# Patient Record
Sex: Female | Born: 1995 | Race: Black or African American | Hispanic: No | Smoking: Never smoker
Health system: Southern US, Community
[De-identification: ages and names within clinical notes are randomized; demographics above are authoritative.]

## PROBLEM LIST (undated history)

## (undated) DIAGNOSIS — F909 Attention-deficit hyperactivity disorder, unspecified type: Secondary | ICD-10-CM

## (undated) DIAGNOSIS — F32A Depression, unspecified: Secondary | ICD-10-CM

## (undated) DIAGNOSIS — Z789 Other specified health status: Secondary | ICD-10-CM

## (undated) DIAGNOSIS — O009 Unspecified ectopic pregnancy without intrauterine pregnancy: Secondary | ICD-10-CM

## (undated) DIAGNOSIS — K219 Gastro-esophageal reflux disease without esophagitis: Secondary | ICD-10-CM

## (undated) DIAGNOSIS — F419 Anxiety disorder, unspecified: Secondary | ICD-10-CM

## (undated) HISTORY — DX: Attention-deficit hyperactivity disorder, unspecified type: F90.9

## (undated) HISTORY — DX: Anxiety disorder, unspecified: F41.9

## (undated) HISTORY — DX: Depression, unspecified: F32.A

## (undated) HISTORY — DX: Gastro-esophageal reflux disease without esophagitis: K21.9

## (undated) HISTORY — PX: FOOT SURGERY: SHX648

---

## 2022-04-22 ENCOUNTER — Ambulatory Visit (INDEPENDENT_AMBULATORY_CARE_PROVIDER_SITE_OTHER): Payer: Managed Care, Other (non HMO) | Admitting: Obstetrics and Gynecology

## 2022-04-22 DIAGNOSIS — Z3200 Encounter for pregnancy test, result unknown: Secondary | ICD-10-CM

## 2022-04-22 LAB — POCT URINE PREGNANCY: Preg Test, Ur: POSITIVE — AB

## 2022-04-22 NOTE — Progress Notes (Unsigned)
Patient presents for pregnancy urine confirmation has the next appointments set up with [facility].CMA for new ob intake.LMP of 03/20/2022 No issues or concerns.

## 2022-04-24 ENCOUNTER — Emergency Department
Admission: EM | Admit: 2022-04-24 | Discharge: 2022-04-25 | Disposition: A | Discharge status: Home / Self Care | Payer: Managed Care, Other (non HMO) | Attending: Emergency Medicine | Admitting: Emergency Medicine

## 2022-04-24 ENCOUNTER — Emergency Department: Payer: Managed Care, Other (non HMO)

## 2022-04-24 ENCOUNTER — Other Ambulatory Visit: Payer: Self-pay

## 2022-04-24 ENCOUNTER — Encounter: Payer: Self-pay | Admitting: Obstetrics and Gynecology

## 2022-04-24 ENCOUNTER — Encounter: Payer: Self-pay | Admitting: Emergency Medicine

## 2022-04-24 DIAGNOSIS — Z3A01 Less than 8 weeks gestation of pregnancy: Secondary | ICD-10-CM | POA: Diagnosis not present

## 2022-04-24 DIAGNOSIS — O23591 Infection of other part of genital tract in pregnancy, first trimester: Secondary | ICD-10-CM | POA: Insufficient documentation

## 2022-04-24 DIAGNOSIS — O00201 Right ovarian pregnancy without intrauterine pregnancy: Secondary | ICD-10-CM | POA: Insufficient documentation

## 2022-04-24 DIAGNOSIS — Z6710 Type A blood, Rh positive: Secondary | ICD-10-CM | POA: Diagnosis not present

## 2022-04-24 DIAGNOSIS — O209 Hemorrhage in early pregnancy, unspecified: Secondary | ICD-10-CM | POA: Diagnosis present

## 2022-04-24 DIAGNOSIS — B9689 Other specified bacterial agents as the cause of diseases classified elsewhere: Secondary | ICD-10-CM | POA: Diagnosis not present

## 2022-04-24 DIAGNOSIS — O4690 Antepartum hemorrhage, unspecified, unspecified trimester: Secondary | ICD-10-CM

## 2022-04-24 LAB — CHLAMYDIA/NGC RT PCR (ARMC ONLY)
Chlamydia Tr: NOT DETECTED
N gonorrhoeae: NOT DETECTED

## 2022-04-24 LAB — CBC WITH DIFFERENTIAL/PLATELET
Abs Immature Granulocytes: 0.01 10*3/uL (ref 0.00–0.07)
Basophils Absolute: 0 10*3/uL (ref 0.0–0.1)
Basophils Relative: 1 %
Eosinophils Absolute: 0.1 10*3/uL (ref 0.0–0.5)
Eosinophils Relative: 1 %
HCT: 37.5 % (ref 36.0–46.0)
Hemoglobin: 11.4 g/dL — ABNORMAL LOW (ref 12.0–15.0)
Immature Granulocytes: 0 %
Lymphocytes Relative: 45 %
Lymphs Abs: 2.3 10*3/uL (ref 0.7–4.0)
MCH: 23.1 pg — ABNORMAL LOW (ref 26.0–34.0)
MCHC: 30.4 g/dL (ref 30.0–36.0)
MCV: 75.9 fL — ABNORMAL LOW (ref 80.0–100.0)
Monocytes Absolute: 0.3 10*3/uL (ref 0.1–1.0)
Monocytes Relative: 6 %
Neutro Abs: 2.5 10*3/uL (ref 1.7–7.7)
Neutrophils Relative %: 47 %
Platelets: 288 10*3/uL (ref 150–400)
RBC: 4.94 MIL/uL (ref 3.87–5.11)
RDW: 15.9 % — ABNORMAL HIGH (ref 11.5–15.5)
WBC: 5.2 10*3/uL (ref 4.0–10.5)
nRBC: 0 % (ref 0.0–0.2)

## 2022-04-24 LAB — URINALYSIS, ROUTINE W REFLEX MICROSCOPIC
Bilirubin Urine: NEGATIVE
Glucose, UA: NEGATIVE mg/dL
Hgb urine dipstick: NEGATIVE
Ketones, ur: NEGATIVE mg/dL
Leukocytes,Ua: NEGATIVE
Nitrite: NEGATIVE
Protein, ur: NEGATIVE mg/dL
Specific Gravity, Urine: 1.026 (ref 1.005–1.030)
pH: 7 (ref 5.0–8.0)

## 2022-04-24 LAB — COMPREHENSIVE METABOLIC PANEL
ALT: 17 U/L (ref 0–44)
AST: 19 U/L (ref 15–41)
Albumin: 3.7 g/dL (ref 3.5–5.0)
Alkaline Phosphatase: 58 U/L (ref 38–126)
Anion gap: 6 (ref 5–15)
BUN: 11 mg/dL (ref 6–20)
CO2: 25 mmol/L (ref 22–32)
Calcium: 9.4 mg/dL (ref 8.9–10.3)
Chloride: 105 mmol/L (ref 98–111)
Creatinine, Ser: 0.88 mg/dL (ref 0.44–1.00)
GFR, Estimated: >60 mL/min (ref >60)
Glucose, Bld: 114 mg/dL — ABNORMAL HIGH (ref 70–99)
Potassium: 3.6 mmol/L (ref 3.5–5.1)
Sodium: 136 mmol/L (ref 135–145)
Total Bilirubin: 0.6 mg/dL (ref 0.3–1.2)
Total Protein: 8.2 g/dL — ABNORMAL HIGH (ref 6.5–8.1)

## 2022-04-24 LAB — WET PREP, GENITAL
Sperm: NONE SEEN
Trich, Wet Prep: NONE SEEN
WBC, Wet Prep HPF POC: <10 (ref <10)
Yeast Wet Prep HPF POC: NONE SEEN

## 2022-04-24 LAB — ABO/RH: ABO/RH(D): A POS

## 2022-04-24 LAB — HCG, QUANTITATIVE, PREGNANCY: hCG, Beta Chain, Quant, S: 1047 m[IU]/mL — ABNORMAL HIGH (ref <5)

## 2022-04-24 LAB — PREGNANCY, URINE: Preg Test, Ur: POSITIVE — AB

## 2022-04-24 MED ORDER — METHOTREXATE FOR ECTOPIC PREGNANCY
50.0000 mg/m2 | Freq: Once | INTRAMUSCULAR | Status: AC
  Administered 2022-04-24: 115 mg via INTRAMUSCULAR
  Filled 2022-04-24: qty 4.6

## 2022-04-24 NOTE — ED Provider Notes (Signed)
[facility] Provider Note    Event Date/Time   First MD Initiated Contact with Patient 04/24/22 1748     (approximate)   History   Vaginal Bleeding   HPI  Amanda Summers is a 26 y.o. female  G1P0 who presents today for evaluation of vaginal bleeding.  Patient reports that this began today.  She reports her last menstrual period was 03/20/2022 and she took a home pregnancy test today which was positive.  She reports that she is having cramping across her low abdomen and began to have spotting today.  She reports that she only noticed it while wiping.  She denies having had to use pads or tampons.  She denies feeling dizziness.  She denies unilateral pain.  She reports that she has noticed slightly mucousy discharge which she attributed to being pregnant.  She denies any concern for sexually transmitted diseases.      Physical Exam   Triage Vital Signs: ED Triage Vitals  Enc Vitals Group     BP 04/24/22 1717 128/79     Pulse Rate 04/24/22 1717 (!) 106     Resp 04/24/22 1717 20     Temp 04/24/22 1717 98.8 F (37.1 C)     Temp Source 04/24/22 1717 Oral     SpO2 04/24/22 1717 99 %     Weight 04/24/22 1718 250 lb (113.4 kg)     Height 04/24/22 1718 5\' 7"  (1.702 m)     Head Circumference --      Peak Flow --      Pain Score 04/24/22 1717 4     Pain Loc --      Pain Edu? --      Excl. in GC? --     Most recent vital signs: Vitals:   04/24/22 1717  BP: 128/79  Pulse: (!) 106  Resp: 20  Temp: 98.8 F (37.1 C)  SpO2: 99%    Physical Exam Vitals and nursing note reviewed.  Constitutional:      General: Awake and alert. No acute distress.    Appearance: Normal appearance. She is well-developed and obese  HENT:     Head: Normocephalic and atraumatic.     Mouth: Mucous membranes are moist.  Eyes:     General: PERRL. Normal EOMs        Right eye: No discharge.        Left eye: No discharge.     Conjunctiva/sclera: Conjunctivae normal.   Cardiovascular:     Rate and Rhythm: Normal rate and regular rhythm.     Pulses: Normal pulses.  Pulmonary:     Effort: Pulmonary effort is normal. No respiratory distress.  Abdominal:     Abdomen is soft. There is mild right sided abdominal tenderness. No rebound or guarding. No distention. GU: Normal genitalia.  Closed os.  Scant blood in vault.  No obvious discharge. No CMT. Right adnexal tenderness present Musculoskeletal:        General: No swelling. Normal range of motion.     Cervical back: Normal range of motion and neck supple.  Lymphadenopathy:     Cervical: No cervical adenopathy.  Skin:    General: Skin is warm and dry.     Capillary Refill: Capillary refill takes less than 2 seconds.     Findings: No rash.  Neurological:     Mental Status: She is alert.      ED Results / Procedures / Treatments   Labs (all labs  ordered are listed, but only abnormal results are displayed) Labs Reviewed  WET PREP, GENITAL - Abnormal; Notable for the following components:      Result Value   Clue Cells Wet Prep HPF POC PRESENT (*)    All other components within normal limits  HCG, QUANTITATIVE, PREGNANCY - Abnormal; Notable for the following components:   hCG, Beta Chain, Quant, S 1,047 (*)    All other components within normal limits  COMPREHENSIVE METABOLIC PANEL - Abnormal; Notable for the following components:   Glucose, Bld 114 (*)    Total Protein 8.2 (*)    All other components within normal limits  CBC WITH DIFFERENTIAL/PLATELET - Abnormal; Notable for the following components:   Hemoglobin 11.4 (*)    MCV 75.9 (*)    MCH 23.1 (*)    RDW 15.9 (*)    All other components within normal limits  PREGNANCY, URINE - Abnormal; Notable for the following components:   Preg Test, Ur POSITIVE (*)    All other components within normal limits  URINALYSIS, ROUTINE W REFLEX MICROSCOPIC - Abnormal; Notable for the following components:   Color, Urine AMBER (*)    APPearance CLOUDY  (*)    All other components within normal limits  CHLAMYDIA/NGC RT PCR ([facility] ONLY)            ABO/RH     EKG     RADIOLOGY I independently reviewed and interpreted imaging and agree with radiologists findings.     PROCEDURES:  Critical Care performed:   Procedures   MEDICATIONS ORDERED IN ED: Medications - No data to display   IMPRESSION / MDM / ASSESSMENT AND PLAN / ED COURSE  I reviewed the triage vital signs and the nursing notes.   Differential diagnosis includes, but is not limited to, implantation bleeding, ectopic pregnancy, inevitable/spontaneous abortion, subchorionic hemorrhage.  Patient is awake and alert, mildly tachycardic at 106 but normotensive and nontoxic in appearance.  No unilateral tenderness on exam.  Labs obtained at triage are overall reassuring with stable H&H.  No RhoGAM needed.  Swab is positive for clue cells.  However, patient is not symptomatic from her BV, after discussion with patient agreed to hold off on treatment given that she is asymptomatic.  She is not concerned about sexually transmitted diseases but agrees with testing.  Urinalysis is not consistent with infection.  Ultrasound obtained. I received a phone call from [facility] radiology. US demonstrates a lesion suspicious for an unruptured ectopic pregnancy. Trace free fluid.  Patient's presentation and findings were discussed with the CNM on-call who has agreed to see the patient.  She plans to discuss with her attending, Dr. Dalbert Garnet.  Patient passed off to [facility] Tammi Sou, NP awaiting OBGYN recommendations and final disposition.  Case was discussed with Dr. Sidney Ace who agrees with assessment and plan.  Patient's presentation is most consistent with acute presentation with potential threat to life or bodily function.   Clinical Course as of 04/24/22 1934  Wynelle Link Apr 24, 2022  8299 I received a phone call from [facility] radiology. [place] ectopic. Sac in right adnexa. Trace free fluid,  appears physiologic, non-bloody [JP]  1918 Discussed with midwife on-call who will discuss with attending Dr. Dalbert Garnet and will come to see patient [JP]  1934 Passed off to [facility] Tammi Sou [JP]    Clinical Course User Index [JP] Jeremiyah Cullens, Herb Grays, PA-C     FINAL CLINICAL IMPRESSION(S) / ED DIAGNOSES   Final diagnoses:  Right ovarian pregnancy without  intrauterine pregnancy  Vaginal bleeding in pregnancy  BV (bacterial vaginosis)     Rx / DC Orders   ED Discharge Orders     None        Note:  This document was prepared using Dragon voice recognition software and may include unintentional dictation errors.   Keturah Shavers 04/24/22 1935    Georga Hacking, MD 04/27/22 1800

## 2022-04-24 NOTE — ED Provider Notes (Cosign Needed)
-----------------------------------------   7:34 PM on 04/24/2022 -----------------------------------------  Blood pressure 128/79, pulse (!) 106, temperature 98.8 F (37.1 C), temperature source Oral, resp. rate 20, height 5\' 7"  (1.702 m), weight 113.4 kg, last menstrual period 03/20/2022, SpO2 99 %.  Assuming care from J. Poggi, PA-C.  In short, Amanda Summers is a 26 y.o. female with a chief complaint of Vaginal Bleeding .  Refer to the original H&P for additional details.  The current plan of care is to await further recommendations from Dr. 26.  2115: Plan will be to admit treat with methotrexate.  Nursing staff from another unit will have to administer medication since it is a chemo drug.  2116 making arrangements.  ----------------------------------------- 11:25 PM on 04/24/2022 ----------------------------------------- PACU RN administered methotrexate.  Patient will be monitored per policy then discharged home.  She will follow-up with Dr. 04/26/2022 in 1 week.   Dalbert Garnet, FNP 04/24/22 2325

## 2022-04-24 NOTE — ED Triage Notes (Signed)
Pt arrived via POV with reports of vaginal bleeding that started today, pt reports home preg test was positive, unknown gestational age, pt reports last period was 03/20/22  Pt also reports cramping. G1P0.    Pt also states she is having some vaginal discharge.

## 2022-04-24 NOTE — ED Provider Triage Note (Signed)
Emergency Medicine Provider Triage Evaluation Note  Amanda Summers , a 26 y.o. female  was evaluated in triage.  Pt complains of vaginal bleeding. Home pregnancy test was positive. LMP 03/20/22. Also having pelvic cramping. G1  Physical Exam  There were no vitals taken for this visit. Gen:   Awake, no distress   Resp:  Normal effort  MSK:   Moves extremities without difficulty  Other:    Medical Decision Making  Medically screening exam initiated at 5:18 PM.  Appropriate orders placed.  Amanda Summers was informed that the remainder of the evaluation will be completed by another provider, this initial triage assessment does not replace that evaluation, and the importance of remaining in the ED until their evaluation is complete.    [name], FNP 04/24/22 1720

## 2022-04-24 NOTE — Consult Note (Cosign Needed)
Consult History and Physical   SERVICE: Gynecology   Patient Name: Amanda Summers Patient MRN:   [id number]  CC: vaginal spotting that started today around 1400, after a positive home pregnancy test on 04/18/22. She denies abdominal pain at the time of assessment, but endorsed intermittent abdominal cramping. N/V, shoulder pain, dizziness.  HPI:   Amanda Summers is a 26 y.o. female  G1P0 who presents today for evaluation of vaginal bleeding.  Patient reports that this began today.  She reports her last menstrual period was 03/20/2022 and she took a home pregnancy test today which was positive.  She reports that she is having cramping across her low abdomen and began to have spotting today.  She reports that she only noticed it while wiping.  She denies having had to use pads or tampons.  She denies feeling dizziness.  She denies unilateral pain.  She reports that she has noticed slightly mucousy discharge which she attributed to being pregnant.  She denies any concern for sexually transmitted diseases.   Review of Systems: positives in bold GEN:   fevers, chills, weight changes, appetite changes, fatigue, night sweats HEENT:  HA, vision changes, hearing loss, congestion, rhinorrhea, sinus pressure, dysphagia CV:   CP, palpitations PULM:  SOB, cough GI:  abd pain, N/V/D/C GU:  dysuria, urgency, frequency MSK:  arthralgias, myalgias, back pain, swelling SKIN:  rashes, color changes, pallor NEURO:  numbness, weakness, tingling, seizures, dizziness, tremors PSYCH:  depression, anxiety, behavioral problems, confusion  HEME/LYMPH:  easy bruising or bleeding ENDO:  heat/cold intolerance  Past Obstetrical History: OB History     Gravida  1   Para      Term      Preterm      AB      Living         SAB      IAB      Ectopic      Multiple      Live Births              Past Gynecologic History: Patient's last menstrual period was 03/20/2022 (exact date). Menstrual irregular  frequency Q 26 days to 31 wks lasting 7 days requiring 4 tampons/day.  Past Medical History: History reviewed. No pertinent past medical history.  Past Surgical History:  History reviewed. No pertinent surgical history.  Family History:  family history is not on file.  Social History:  Social History   Socioeconomic History   Marital status: Married    Spouse name: Not on file   Number of children: Not on file   Years of education: Not on file   Highest education level: Not on file  Occupational History   Not on file  Tobacco Use   Smoking status: Never   Smokeless tobacco: Never  Substance and Sexual Activity   Alcohol use: Not on file   Drug use: Not on file   Sexual activity: Not on file  Other Topics Concern   Not on file  Social History Narrative   Not on file   Social Determinants of Health   Financial Resource Strain: Not on file  Food Insecurity: Not on file  Transportation Needs: Not on file  Physical Activity: Not on file  Stress: Not on file  Social Connections: Not on file  Intimate Partner Violence: Not on file    Home Medications:  Medications reconciled in EPIC  No current facility-administered medications on file prior to encounter.   No current outpatient medications on  file prior to encounter.    Allergies:  No Known Allergies  Physical Exam:  Temp:  [98.8 F (37.1 C)] 98.8 F (37.1 C) (06/18 1717) Pulse Rate:  [96-106] 96 (06/18 2000) Resp:  [19-20] 19 (06/18 2000) BP: (107-128)/(67-79) 107/67 (06/18 2000) SpO2:  [99 %-100 %] 100 % (06/18 2000) Weight:  [113.4 kg] 113.4 kg (06/18 1718)   Physical Exam Constitutional:      Appearance: Normal appearance. She is obese.  Genitourinary:     Pelvic Tanner Score: 5/5. Cardiovascular:     Rate and Rhythm: Normal rate and regular rhythm.     Pulses: Normal pulses.     Heart sounds: Normal heart sounds.  Pulmonary:     Effort: Pulmonary effort is normal.     Breath sounds: Normal  breath sounds.  Abdominal:     Palpations: Abdomen is soft. There is no mass.     Tenderness: There is abdominal tenderness. There is no guarding or rebound.  Musculoskeletal:        General: Normal range of motion.     Cervical back: Normal range of motion and neck supple.  Neurological:     Mental Status: She is alert and oriented to person, place, and time.  Skin:    General: Skin is warm and dry.  Psychiatric:        Mood and Affect: Mood normal.        Behavior: Behavior normal.        Thought Content: Thought content normal.        Judgment: Judgment normal.       Labs/Studies:   CBC and Coags:  Lab Results  Component Value Date   WBC 5.2 04/24/2022   NEUTOPHILPCT 47 04/24/2022   EOSPCT 1 04/24/2022   BASOPCT 1 04/24/2022   LYMPHOPCT 45 04/24/2022   HGB 11.4 (L) 04/24/2022   HCT 37.5 04/24/2022   MCV 75.9 (L) 04/24/2022   PLT 288 04/24/2022   CMP:  Lab Results  Component Value Date   NA 136 04/24/2022   K 3.6 04/24/2022   CL 105 04/24/2022   CO2 25 04/24/2022   BUN 11 04/24/2022   CREATININE 0.88 04/24/2022   PROT 8.2 (H) 04/24/2022   BILITOT 0.6 04/24/2022   ALT 17 04/24/2022   AST 19 04/24/2022   ALKPHOS 58 04/24/2022   Other Labs: Lab Orders         Chlamydia/NGC rt PCR ([facility] only)         Wet prep, genital         hCG, quantitative, pregnancy         Comprehensive metabolic panel         CBC with Differential         Pregnancy, urine         Urinalysis, Routine w reflex microscopic Urine, Clean Catch         hCG, quantitative, pregnancy         CBC with Differential         Comprehensive metabolic panel      TVUS:  04/24/22 Other Imaging: US OB LESS THAN 14 WEEKS WITH OB TRANSVAGINAL  Result Date: 04/24/2022 CLINICAL DATA:  46962. Spotting. Quantitative beta HCG 1,047. Last menstrual. 03/20/2022. EXAM: OBSTETRIC <14 WK [place] AND TRANSVAGINAL OB US TECHNIQUE: Both transabdominal and transvaginal ultrasound examinations were performed for  complete evaluation of the gestation as well as the maternal uterus, adnexal regions, and pelvic cul-de-sac. Transvaginal technique was performed to  assess early pregnancy. COMPARISON:  None Available. FINDINGS: Intrauterine gestational sac: None Yolk sac:  Not Visualized. Embryo:  Not Visualized. Cardiac Activity: Not Visualized. Maternal uterus/adnexae: Nonspecific slightly heterogeneous endometrium measuring 12 mm. The uterus is otherwise unremarkable. The left ovary and adnexa are unremarkable. The right ovary is unremarkable with an adjacent right adnexal echogenic lesion with an anechoic center that resembles a decidual sac. Associated mild vascularity to the lesion is noted. Other: At least trace small volume simple free fluid within the right adnexa and pelvis. IMPRESSION: 1. Right adnexal echogenic lesion suspicious for an unruptured ectopic pregnancy. 2. At least trace small volume simple free fluid within the right adnexa and pelvis. Finding may be physiologic. No definite signs of blood products. 3. No intrauterine pregnancy identified. These results were called by telephone at the time of interpretation on 04/24/2022 at 7:14 pm to provider [facility] , who verbally acknowledged these results. Electronically Signed   By: Tish Frederickson M.D.   On: 04/24/2022 19:15     Assessment / Plan:   ELLOUISE MCWHIRTER is a 26 y.o. G1P0 who presents with 5 weeks unruptured ectopic pregnancy. Consult with Dr Dalbert Garnet Initiate methotrexate protocol  F/u requirements/warning signs  Methotrexate Treatment Protocol for Ectopic Pregnancy  Pretreatment testing and instructions  hCG concentration  Transvaginal ultrasound  Blood group and Rh(D) typing; give Rhogam 300 mcg IM, if indicated  Complete blood count  Liver and renal function tests  Discontinue folic acid supplements  Counsel patient to avoid NSAIDs, recommend acetaminophen if an analgesic is needed  Advise patient to refrain from sexual intercourse and  strenuous exercise  Treatment day  Single dose protocol   1  hCG.  Administer Methotrexate 50 mg/m2 body surface area IM  4  hCG  7  hCG  If <15 percent hCG decline from day 4 to 7, give additional dose of methotrexate 50 mg/m2 IM  If ?15 percent hCG decline from day 4 to 7, draw hCG weekly until undetectable  14  hCG  If <15 percent hCG decline from day 7 to 14, give additional dose of methotrexate 50 mg/m2 IM  If ?15 percent hCG decline from day 7 to 14, check hCG weekly until undetectable  21 and 28  If 3 doses have been given and there is a <15 percent hCG decline from day 21 to 28, proceed with laparoscopic surgery  Laparoscopy  If severe abdominal pain or an acute abdomen suggestive of tubal rupture occurs If ultrasonography reveals greater than 300 mL pelvic or other intraperitoneal fluid  The hCG concentration usually declines to less than 15 mIU/mL by 35 days postinjection but may take as long as 109 days. If the hCG does not decline to zero, a new pregnancy should be excluded; if the hCG is rising, a transvaginal ultrasound should be performed. Alternatively, some patients have a slow clearance of serum hCG. If three weekly values are similar, consider an additional dose of MTX (50 mg/m2) not to exceed the recommended maximum of three total doses. This typically accelerates the decline of serum hCG. The risk of gestational trophoblastic disease is low. Folinic acid rescue is not required for women treated with the single-dose protocol, even if multiple doses are ultimately given.   Prepared with data from:   [facility]. Clinical practice. Ectopic pregnancy. Malva Limes Med 2009; 361:379  American College of Obstetricians and Gynecologists. ACOG Practice Bulletin No. 94: Medical management of ectopic pregnancy. Obstet Gynecol 2008; 829:5621.   Thank you  for the opportunity to be involved with this patient's care.  ----- Chari Manning CNM Midwife [facility], Department of  OB/GYN [facility]

## 2022-04-25 NOTE — ED Notes (Signed)
Pt verbalized understanding of discharge instructions and follow-up care instructions. Pt advised if symptoms worsen to return to ED.  

## 2022-04-27 ENCOUNTER — Other Ambulatory Visit: Payer: Self-pay | Admitting: Obstetrics and Gynecology

## 2022-04-27 ENCOUNTER — Other Ambulatory Visit: Payer: Self-pay

## 2022-04-27 ENCOUNTER — Emergency Department
Admission: EM | Admit: 2022-04-27 | Discharge: 2022-04-27 | Disposition: A | Discharge status: Home / Self Care | Payer: Managed Care, Other (non HMO) | Attending: Emergency Medicine | Admitting: Emergency Medicine

## 2022-04-27 ENCOUNTER — Emergency Department: Payer: Managed Care, Other (non HMO)

## 2022-04-27 DIAGNOSIS — Z3A00 Weeks of gestation of pregnancy not specified: Secondary | ICD-10-CM | POA: Insufficient documentation

## 2022-04-27 DIAGNOSIS — O209 Hemorrhage in early pregnancy, unspecified: Secondary | ICD-10-CM | POA: Diagnosis present

## 2022-04-27 DIAGNOSIS — O00101 Right tubal pregnancy without intrauterine pregnancy: Secondary | ICD-10-CM | POA: Diagnosis not present

## 2022-04-27 DIAGNOSIS — O00109 Unspecified tubal pregnancy without intrauterine pregnancy: Secondary | ICD-10-CM

## 2022-04-27 LAB — COMPREHENSIVE METABOLIC PANEL
ALT: 16 U/L (ref 0–44)
AST: 18 U/L (ref 15–41)
Albumin: 3.6 g/dL (ref 3.5–5.0)
Alkaline Phosphatase: 53 U/L (ref 38–126)
Anion gap: 5 (ref 5–15)
BUN: 10 mg/dL (ref 6–20)
CO2: 25 mmol/L (ref 22–32)
Calcium: 9 mg/dL (ref 8.9–10.3)
Chloride: 107 mmol/L (ref 98–111)
Creatinine, Ser: 0.7 mg/dL (ref 0.44–1.00)
GFR, Estimated: >60 mL/min (ref >60)
Glucose, Bld: 100 mg/dL — ABNORMAL HIGH (ref 70–99)
Potassium: 3.4 mmol/L — ABNORMAL LOW (ref 3.5–5.1)
Sodium: 137 mmol/L (ref 135–145)
Total Bilirubin: 0.4 mg/dL (ref 0.3–1.2)
Total Protein: 7.4 g/dL (ref 6.5–8.1)

## 2022-04-27 LAB — CBC
HCT: 35.2 % — ABNORMAL LOW (ref 36.0–46.0)
Hemoglobin: 10.7 g/dL — ABNORMAL LOW (ref 12.0–15.0)
MCH: 23.1 pg — ABNORMAL LOW (ref 26.0–34.0)
MCHC: 30.4 g/dL (ref 30.0–36.0)
MCV: 75.9 fL — ABNORMAL LOW (ref 80.0–100.0)
Platelets: 262 10*3/uL (ref 150–400)
RBC: 4.64 MIL/uL (ref 3.87–5.11)
RDW: 15.7 % — ABNORMAL HIGH (ref 11.5–15.5)
WBC: 5.3 10*3/uL (ref 4.0–10.5)
nRBC: 0 % (ref 0.0–0.2)

## 2022-04-27 LAB — HCG, QUANTITATIVE, PREGNANCY: hCG, Beta Chain, Quant, S: 1614 m[IU]/mL — ABNORMAL HIGH (ref <5)

## 2022-04-27 MED ORDER — OXYCODONE HCL 5 MG PO TABS: 2.5000 mg | ORAL_TABLET | Freq: Four times a day (QID) | ORAL | 0 refills | Status: AC | PRN

## 2022-04-27 MED ORDER — OXYCODONE HCL 5 MG PO TABS
5.0000 mg | ORAL_TABLET | Freq: Once | ORAL | Status: AC
  Administered 2022-04-27: 5 mg via ORAL
  Filled 2022-04-27: qty 1

## 2022-04-27 NOTE — ED Notes (Signed)
Pt sitting up in bed, pt states that she is ready to go home, pt states that her husband is coming to get her. Pt verbalized understanding about repeat blood draw on Thursday and Sunday.

## 2022-04-27 NOTE — ED Notes (Signed)
Pt sitting up in bed, pt reports some RLQ tenderness, states that she had methotrexate on Sunday for an ectopic pregnancy, pt denies bleeding.

## 2022-04-27 NOTE — ED Provider Notes (Signed)
[facility] Provider Note    Event Date/Time   First MD Initiated Contact with Patient 04/27/22 1015     (approximate)   History   Flank Pain   HPI  Amanda Summers is a 26 y.o. female with ectopic pregnancy who comes in with right abdominal pain.  Patient was seen on 6/18 diagnosed with ectopic pregnancy and treated with methotrexate.  She reports having some increasing pain currently 4 out of 10 without any nausea or vomiting.  She had a little bit of vaginal spotting when she was first diagnosed but denies any vaginal bleeding at this time.  She was not sure if this was normal to have but she wanted to come in and be evaluated.    Physical Exam   Triage Vital Signs: ED Triage Vitals  Enc Vitals Group     BP 04/27/22 0807 113/69     Pulse Rate 04/27/22 0807 95     Resp 04/27/22 0807 18     Temp 04/27/22 0807 98.5 F (36.9 C)     Temp Source 04/27/22 0807 Oral     SpO2 04/27/22 0807 96 %     Weight 04/27/22 0808 250 lb (113.4 kg)     Height 04/27/22 0808 5\' 7"  (1.702 m)     Head Circumference --      Peak Flow --      Pain Score 04/27/22 0808 4     Pain Loc --      Pain Edu? --      Excl. in GC? --     Most recent vital signs: Vitals:   04/27/22 0807  BP: 113/69  Pulse: 95  Resp: 18  Temp: 98.5 F (36.9 C)  SpO2: 96%     General: Awake, no distress.  CV:  Good peripheral perfusion.  Resp:  Normal effort.  Abd:  No distention.  Slightly tender in the right lower quadrant but no rebound.  No guarding Other:     ED Results / Procedures / Treatments   Labs (all labs ordered are listed, but only abnormal results are displayed) Labs Reviewed  CBC - Abnormal; Notable for the following components:      Result Value   Hemoglobin 10.7 (*)    HCT 35.2 (*)    MCV 75.9 (*)    MCH 23.1 (*)    RDW 15.7 (*)    All other components within normal limits  COMPREHENSIVE METABOLIC PANEL - Abnormal; Notable for the following components:    Potassium 3.4 (*)    Glucose, Bld 100 (*)    All other components within normal limits  HCG, QUANTITATIVE, PREGNANCY - Abnormal; Notable for the following components:   hCG, Beta Chain, Quant, S 1,614 (*)    All other components within normal limits        RADIOLOGY I have reviewed the ultrasound personally and interpreted and she is got a right ectopic pregnancy  IMPRESSION: Slight increase in size of RIGHT adnexal ectopic pregnancy since prior study.   Remainder of exam unremarkable.   Critical Value/emergent results were called by telephone at the time of interpretation on 04/27/2022 at 10:26 am to provider [facility] , who verbally acknowledged these results  PROCEDURES:  Critical Care performed: No  Procedures   MEDICATIONS ORDERED IN ED: Medications  oxyCODONE (Oxy IR/ROXICODONE) immediate release tablet 5 mg (has no administration in time range)     IMPRESSION / MDM / ASSESSMENT AND PLAN / ED COURSE  I reviewed the triage vital signs and the nursing notes.   Patient's presentation is most consistent with acute presentation with potential threat to life or bodily function.   Concern for possible ruptured ectopic versus just pain from the known ectopic, anemia.  Will get labs to further evaluate and ultrasound  hCG was slightly increased.  CMP reassuring.  CBC slightly low hemoglobin but not too much different than previous.  IMPRESSION: Slight increase in size of RIGHT adnexal ectopic pregnancy since prior study.   Remainder of exam unremarkable.   Critical Value/emergent results were called by telephone at the time of interpretation on 04/27/2022 at 10:26 am to provider [facility] , who verbally acknowledged these results  Discussed with Dr. Feliberto Gottron who discussed with Dr. Dalbert Garnet who is okay with patient going home she is very reassuring abdominal exam.  She is going to follow-up for hCG tomorrow and on Sunday.  Patient states that she is not aware  of these hCG test and that she does not have any [facility] MyChart.  We will message OB to make sure that she gets the adequate testing outpatient  We discussed return precautions and they were okay with me giving a few oxycodone to help with pain but patient understands not to try to mask any pain and if the pain is similar to the day then its okay to use a little pain medicine but if it is getting more severe that she would need to come back here for reevaluation she expressed understanding felt comfortable with     FINAL CLINICAL IMPRESSION(S) / ED DIAGNOSES   Final diagnoses:  Right tubal pregnancy, unspecified whether intrauterine pregnancy present     Rx / DC Orders   ED Discharge Orders          Ordered    oxyCODONE (ROXICODONE) 5 MG immediate release tablet  Every 6 hours PRN        06 /21/23 1232             Note:  This document was prepared using Dragon voice recognition software and may include unintentional dictation errors.   1233, MD 04/27/22 1233

## 2022-04-27 NOTE — Progress Notes (Signed)
Pt need to get BHCG 04/28/22 at 05/01/22 outpt labs for ectopic pregnancy treated with MTX

## 2022-04-27 NOTE — Discharge Instructions (Addendum)
You need to follow-up in the lab for repeat hCG tomorrow and on Sunday.  Return to the ER if you develop worsening pain in the lower abdomen bleeding more than a pad an hour or any other concerns.  Do not take prenatals or eat leafy green vegetables.  Take oxycodone as prescribed. Do not drink alcohol, drive or participate in any other potentially dangerous activities while taking this medication as it may make you sleepy. Do not take this medication with any other sedating medications, either prescription or over-the-counter. If you were prescribed Percocet or Vicodin, do not take these with acetaminophen (Tylenol) as it is already contained within these medications.  This medication is an opiate (or narcotic) pain medication and can be habit forming. Use it as little as possible to achieve adequate pain control. Do not use or use it with extreme caution if you have a history of opiate abuse or dependence. If you are on a pain contract with your primary care doctor or a pain specialist, be sure to let them know you were prescribed this medication today from the Emergency Department. This medication is intended for your use only - do not give any to anyone else and keep it in a secure place where nobody else, especially children, have access to it.

## 2022-04-27 NOTE — ED Triage Notes (Signed)
Pt in with co right lower quad pain, states was here Sunday and given methotrexate after ultrasound showed nonviable pregnancy.

## 2022-04-28 ENCOUNTER — Other Ambulatory Visit
Admission: RE | Admit: 2022-04-28 | Discharge: 2022-04-28 | Disposition: A | Discharge status: Home / Self Care | Payer: Managed Care, Other (non HMO) | Attending: Obstetrics and Gynecology | Admitting: Obstetrics and Gynecology

## 2022-04-28 DIAGNOSIS — O00109 Unspecified tubal pregnancy without intrauterine pregnancy: Secondary | ICD-10-CM | POA: Insufficient documentation

## 2022-04-28 LAB — HCG, QUANTITATIVE, PREGNANCY: hCG, Beta Chain, Quant, S: 1759 m[IU]/mL — ABNORMAL HIGH (ref <5)

## 2022-05-02 ENCOUNTER — Encounter: Payer: Self-pay | Admitting: Obstetrics and Gynecology

## 2022-05-02 ENCOUNTER — Other Ambulatory Visit
Admission: RE | Admit: 2022-05-02 | Discharge: 2022-05-02 | Disposition: A | Discharge status: Home / Self Care | Payer: Managed Care, Other (non HMO) | Attending: Obstetrics and Gynecology | Admitting: Obstetrics and Gynecology

## 2022-05-02 DIAGNOSIS — O00109 Unspecified tubal pregnancy without intrauterine pregnancy: Secondary | ICD-10-CM | POA: Insufficient documentation

## 2022-05-02 LAB — HCG, QUANTITATIVE, PREGNANCY: hCG, Beta Chain, Quant, S: 1039 m[IU]/mL — ABNORMAL HIGH (ref <5)

## 2022-05-09 ENCOUNTER — Encounter: Payer: Self-pay | Admitting: Emergency Medicine

## 2022-05-09 ENCOUNTER — Emergency Department: Payer: Managed Care, Other (non HMO)

## 2022-05-09 ENCOUNTER — Other Ambulatory Visit: Payer: Self-pay

## 2022-05-09 DIAGNOSIS — O3480 Maternal care for other abnormalities of pelvic organs, unspecified trimester: Secondary | ICD-10-CM | POA: Insufficient documentation

## 2022-05-09 DIAGNOSIS — K661 Hemoperitoneum: Secondary | ICD-10-CM | POA: Insufficient documentation

## 2022-05-09 DIAGNOSIS — N838 Other noninflammatory disorders of ovary, fallopian tube and broad ligament: Secondary | ICD-10-CM | POA: Diagnosis not present

## 2022-05-09 DIAGNOSIS — O99519 Diseases of the respiratory system complicating pregnancy, unspecified trimester: Secondary | ICD-10-CM | POA: Diagnosis not present

## 2022-05-09 DIAGNOSIS — Z3A00 Weeks of gestation of pregnancy not specified: Secondary | ICD-10-CM | POA: Insufficient documentation

## 2022-05-09 DIAGNOSIS — O00101 Right tubal pregnancy without intrauterine pregnancy: Secondary | ICD-10-CM | POA: Insufficient documentation

## 2022-05-09 DIAGNOSIS — O99210 Obesity complicating pregnancy, unspecified trimester: Secondary | ICD-10-CM | POA: Insufficient documentation

## 2022-05-09 DIAGNOSIS — J45909 Unspecified asthma, uncomplicated: Secondary | ICD-10-CM | POA: Insufficient documentation

## 2022-05-09 DIAGNOSIS — O009 Unspecified ectopic pregnancy without intrauterine pregnancy: Secondary | ICD-10-CM | POA: Diagnosis present

## 2022-05-09 LAB — COMPREHENSIVE METABOLIC PANEL
ALT: 14 U/L (ref 0–44)
AST: 18 U/L (ref 15–41)
Albumin: 3.8 g/dL (ref 3.5–5.0)
Alkaline Phosphatase: 54 U/L (ref 38–126)
Anion gap: 6 (ref 5–15)
BUN: 12 mg/dL (ref 6–20)
CO2: 28 mmol/L (ref 22–32)
Calcium: 9.1 mg/dL (ref 8.9–10.3)
Chloride: 103 mmol/L (ref 98–111)
Creatinine, Ser: 0.87 mg/dL (ref 0.44–1.00)
GFR, Estimated: >60 mL/min (ref >60)
Glucose, Bld: 93 mg/dL (ref 70–99)
Potassium: 3.6 mmol/L (ref 3.5–5.1)
Sodium: 137 mmol/L (ref 135–145)
Total Bilirubin: 0.6 mg/dL (ref 0.3–1.2)
Total Protein: 7.7 g/dL (ref 6.5–8.1)

## 2022-05-09 LAB — URINALYSIS, ROUTINE W REFLEX MICROSCOPIC
Bilirubin Urine: NEGATIVE
Glucose, UA: NEGATIVE mg/dL
Hgb urine dipstick: NEGATIVE
Ketones, ur: NEGATIVE mg/dL
Leukocytes,Ua: NEGATIVE
Nitrite: NEGATIVE
Protein, ur: NEGATIVE mg/dL
Specific Gravity, Urine: 1.024 (ref 1.005–1.030)
pH: 7 (ref 5.0–8.0)

## 2022-05-09 LAB — CBC
HCT: 35.4 % — ABNORMAL LOW (ref 36.0–46.0)
Hemoglobin: 10.7 g/dL — ABNORMAL LOW (ref 12.0–15.0)
MCH: 23 pg — ABNORMAL LOW (ref 26.0–34.0)
MCHC: 30.2 g/dL (ref 30.0–36.0)
MCV: 76 fL — ABNORMAL LOW (ref 80.0–100.0)
Platelets: 268 10*3/uL (ref 150–400)
RBC: 4.66 MIL/uL (ref 3.87–5.11)
RDW: 15.7 % — ABNORMAL HIGH (ref 11.5–15.5)
WBC: 5 10*3/uL (ref 4.0–10.5)
nRBC: 0 % (ref 0.0–0.2)

## 2022-05-09 LAB — HCG, QUANTITATIVE, PREGNANCY: hCG, Beta Chain, Quant, S: 816 m[IU]/mL — ABNORMAL HIGH (ref <5)

## 2022-05-09 NOTE — ED Triage Notes (Signed)
Pt was dx with ectopic pregnancy 2 weeks and given methotrexate. Pt has follow up apptm last week where they did blood work. Pt states has had increased RLQ pain Saturday.

## 2022-05-10 ENCOUNTER — Emergency Department: Payer: Managed Care, Other (non HMO) | Admitting: Anesthesiology

## 2022-05-10 ENCOUNTER — Encounter: Payer: Self-pay | Admitting: Obstetrics and Gynecology

## 2022-05-10 ENCOUNTER — Other Ambulatory Visit: Payer: Self-pay

## 2022-05-10 ENCOUNTER — Ambulatory Visit
Admission: EM | Admit: 2022-05-10 | Discharge: 2022-05-10 | Disposition: A | Discharge status: Home / Self Care | Payer: Managed Care, Other (non HMO) | Attending: Emergency Medicine | Admitting: Emergency Medicine

## 2022-05-10 ENCOUNTER — Encounter
Admission: EM | Disposition: A | Discharge status: Home / Self Care | Payer: Self-pay | Source: Home / Self Care | Attending: Emergency Medicine

## 2022-05-10 DIAGNOSIS — K661 Hemoperitoneum: Secondary | ICD-10-CM | POA: Diagnosis present

## 2022-05-10 DIAGNOSIS — O009 Unspecified ectopic pregnancy without intrauterine pregnancy: Secondary | ICD-10-CM

## 2022-05-10 HISTORY — PX: DIAGNOSTIC LAPAROSCOPY WITH REMOVAL OF ECTOPIC PREGNANCY: SHX6449

## 2022-05-10 HISTORY — DX: Other specified health status: Z78.9

## 2022-05-10 HISTORY — PX: LAPAROSCOPIC UNILATERAL SALPINGECTOMY: SHX5934

## 2022-05-10 LAB — SAMPLE TO BLOOD BANK

## 2022-05-10 SURGERY — LAPAROSCOPY, WITH ECTOPIC PREGNANCY SURGICAL TREATMENT
Anesthesia: General | Site: Abdomen | Laterality: Right

## 2022-05-10 MED ORDER — SODIUM CHLORIDE FLUSH 0.9 % IV SOLN
INTRAVENOUS | Status: AC
  Filled 2022-05-10: qty 3

## 2022-05-10 MED ORDER — EPHEDRINE SULFATE (PRESSORS) 50 MG/ML IJ SOLN
INTRAMUSCULAR | Status: DC | PRN
  Administered 2022-05-10: 10 mg via INTRAVENOUS

## 2022-05-10 MED ORDER — MORPHINE SULFATE (PF) 4 MG/ML IV SOLN
4.0000 mg | Freq: Once | INTRAVENOUS | Status: AC
  Administered 2022-05-10: 4 mg via INTRAVENOUS
  Filled 2022-05-10: qty 1

## 2022-05-10 MED ORDER — LACTATED RINGERS IV SOLN: INTRAVENOUS | Status: DC

## 2022-05-10 MED ORDER — PROPOFOL 10 MG/ML IV BOLUS
INTRAVENOUS | Status: AC
  Filled 2022-05-10: qty 20

## 2022-05-10 MED ORDER — ACETAMINOPHEN 10 MG/ML IV SOLN
INTRAVENOUS | Status: AC
  Filled 2022-05-10: qty 100

## 2022-05-10 MED ORDER — PROMETHAZINE HCL 25 MG/ML IJ SOLN: 6.2500 mg | INTRAMUSCULAR | Status: DC | PRN

## 2022-05-10 MED ORDER — HYDROCODONE-ACETAMINOPHEN 5-325 MG PO TABS
1.0000 | ORAL_TABLET | Freq: Once | ORAL | Status: AC
  Administered 2022-05-10: 1 via ORAL

## 2022-05-10 MED ORDER — PROMETHAZINE HCL 25 MG/ML IJ SOLN
INTRAMUSCULAR | Status: AC
  Filled 2022-05-10: qty 1

## 2022-05-10 MED ORDER — MIDAZOLAM HCL 2 MG/2ML IJ SOLN
INTRAMUSCULAR | Status: AC
  Filled 2022-05-10: qty 2

## 2022-05-10 MED ORDER — DEXAMETHASONE SODIUM PHOSPHATE 10 MG/ML IJ SOLN
INTRAMUSCULAR | Status: DC | PRN
  Administered 2022-05-10: 10 mg via INTRAVENOUS

## 2022-05-10 MED ORDER — FENTANYL CITRATE (PF) 100 MCG/2ML IJ SOLN
INTRAMUSCULAR | Status: DC | PRN
  Administered 2022-05-10 (×2): 50 ug via INTRAVENOUS

## 2022-05-10 MED ORDER — IBUPROFEN 600 MG PO TABS
ORAL_TABLET | ORAL | Status: AC
  Administered 2022-05-10: 600 mg
  Filled 2022-05-10: qty 1

## 2022-05-10 MED ORDER — LACTATED RINGERS IV BOLUS
1000.0000 mL | Freq: Once | INTRAVENOUS | Status: AC
  Administered 2022-05-10: 1000 mL via INTRAVENOUS

## 2022-05-10 MED ORDER — LACTATED RINGERS IV SOLN: INTRAVENOUS | Status: DC | PRN

## 2022-05-10 MED ORDER — HYDROCODONE-ACETAMINOPHEN 5-325 MG PO TABS
ORAL_TABLET | ORAL | Status: AC
  Filled 2022-05-10: qty 1

## 2022-05-10 MED ORDER — ONDANSETRON HCL 4 MG/2ML IJ SOLN
4.0000 mg | INTRAMUSCULAR | Status: AC
  Administered 2022-05-10: 4 mg via INTRAVENOUS
  Filled 2022-05-10: qty 2

## 2022-05-10 MED ORDER — LACTATED RINGERS IR SOLN
Status: DC | PRN
  Administered 2022-05-10: 100 mL

## 2022-05-10 MED ORDER — LIDOCAINE HCL (CARDIAC) PF 100 MG/5ML IV SOSY
PREFILLED_SYRINGE | INTRAVENOUS | Status: DC | PRN
  Administered 2022-05-10: 100 mg via INTRAVENOUS

## 2022-05-10 MED ORDER — BUPIVACAINE HCL (PF) 0.5 % IJ SOLN
INTRAMUSCULAR | Status: DC | PRN
  Administered 2022-05-10: 12 mL

## 2022-05-10 MED ORDER — SUCCINYLCHOLINE CHLORIDE 200 MG/10ML IV SOSY
PREFILLED_SYRINGE | INTRAVENOUS | Status: DC | PRN
  Administered 2022-05-10: 120 mg via INTRAVENOUS

## 2022-05-10 MED ORDER — IBUPROFEN 600 MG PO TABS
600.0000 mg | ORAL_TABLET | Freq: Once | ORAL | Status: AC
  Filled 2022-05-10: qty 1

## 2022-05-10 MED ORDER — ONDANSETRON HCL 4 MG/2ML IJ SOLN
INTRAMUSCULAR | Status: DC | PRN
  Administered 2022-05-10: 4 mg via INTRAVENOUS

## 2022-05-10 MED ORDER — FENTANYL CITRATE (PF) 100 MCG/2ML IJ SOLN
INTRAMUSCULAR | Status: AC
  Filled 2022-05-10: qty 2

## 2022-05-10 MED ORDER — PHENYLEPHRINE HCL (PRESSORS) 10 MG/ML IV SOLN
INTRAVENOUS | Status: DC | PRN
  Administered 2022-05-10 (×4): 100 ug via INTRAVENOUS

## 2022-05-10 MED ORDER — PROPOFOL 10 MG/ML IV BOLUS
INTRAVENOUS | Status: DC | PRN
  Administered 2022-05-10: 200 mg via INTRAVENOUS

## 2022-05-10 MED ORDER — HYDROCODONE-ACETAMINOPHEN 5-325 MG PO TABS: 1.0000 | ORAL_TABLET | Freq: Four times a day (QID) | ORAL | 0 refills | Status: AC | PRN

## 2022-05-10 MED ORDER — MIDAZOLAM HCL 2 MG/2ML IJ SOLN
INTRAMUSCULAR | Status: DC | PRN
  Administered 2022-05-10: 2 mg via INTRAVENOUS

## 2022-05-10 MED ORDER — IBUPROFEN 600 MG PO TABS: 600.0000 mg | ORAL_TABLET | Freq: Four times a day (QID) | ORAL | 0 refills | Status: DC

## 2022-05-10 MED ORDER — 0.9 % SODIUM CHLORIDE (POUR BTL) OPTIME
TOPICAL | Status: DC | PRN
  Administered 2022-05-10: 10 mL

## 2022-05-10 MED ORDER — FENTANYL CITRATE (PF) 100 MCG/2ML IJ SOLN
25.0000 ug | INTRAMUSCULAR | Status: DC | PRN
  Administered 2022-05-10 (×5): 25 ug via INTRAVENOUS

## 2022-05-10 MED ORDER — SUGAMMADEX SODIUM 500 MG/5ML IV SOLN
INTRAVENOUS | Status: DC | PRN
  Administered 2022-05-10: 250 mg via INTRAVENOUS

## 2022-05-10 MED ORDER — BUPIVACAINE HCL (PF) 0.5 % IJ SOLN
INTRAMUSCULAR | Status: AC
  Filled 2022-05-10: qty 30

## 2022-05-10 SURGICAL SUPPLY — 50 items
ANCHOR TIS RET SYS 235ML (MISCELLANEOUS) ×1 IMPLANT
BACTOSHIELD CHG 4% 4OZ (MISCELLANEOUS) ×1
BAG URINE DRAIN 2000ML AR STRL (UROLOGICAL SUPPLIES) ×3 IMPLANT
BLADE SURG SZ11 CARB STEEL (BLADE) ×3 IMPLANT
CATH FOL 2WAY LX 16X5 (CATHETERS) ×3 IMPLANT
CHLORAPREP W/TINT 26 (MISCELLANEOUS) ×2 IMPLANT
DERMABOND ADVANCED (GAUZE/BANDAGES/DRESSINGS) ×1
DERMABOND ADVANCED .7 DNX12 (GAUZE/BANDAGES/DRESSINGS) ×2 IMPLANT
DRAPE 3/4 80X56 (DRAPES) ×3 IMPLANT
DRAPE LEGGINS SURG 28X43 STRL (DRAPES) ×1 IMPLANT
DRAPE UNDER BUTTOCK W/FLU (DRAPES) ×3 IMPLANT
GLOVE BIO SURGEON STRL SZ7 (GLOVE) ×6 IMPLANT
GLOVE SURG UNDER POLY LF SZ7.5 (GLOVE) ×5 IMPLANT
GOWN STRL REUS W/ TWL LRG LVL3 (GOWN DISPOSABLE) ×6 IMPLANT
GOWN STRL REUS W/TWL LRG LVL3 (GOWN DISPOSABLE) ×3
GRASPER SUT TROCAR 14GX15 (MISCELLANEOUS) ×3 IMPLANT
IRRIGATION STRYKERFLOW (MISCELLANEOUS) IMPLANT
IRRIGATOR STRYKERFLOW (MISCELLANEOUS) ×3
IV LACTATED RINGERS 1000ML (IV SOLUTION) ×3 IMPLANT
KIT PINK PAD W/HEAD ARE REST (MISCELLANEOUS) ×3 IMPLANT
KIT PINK PAD W/HEAD ARM REST (MISCELLANEOUS) ×2 IMPLANT
KIT TURNOVER CYSTO (KITS) ×3 IMPLANT
LABEL OR SOLS (LABEL) ×3 IMPLANT
LIGASURE LAP MARYLAND 5MM 37CM (ELECTROSURGICAL) ×1 IMPLANT
LIGASURE VESSEL 5MM BLUNT TIP (ELECTROSURGICAL) IMPLANT
MANIFOLD NEPTUNE II (INSTRUMENTS) ×3 IMPLANT
MANIPULATOR UTERINE 4.5 ZUMI (MISCELLANEOUS) IMPLANT
NEEDLE HYPO 22GX1.5 SAFETY (NEEDLE) ×3 IMPLANT
NS IRRIG 500ML POUR BTL (IV SOLUTION) ×3 IMPLANT
PACK LAP CHOLECYSTECTOMY (MISCELLANEOUS) ×3 IMPLANT
PAD OB MATERNITY 4.3X12.25 (PERSONAL CARE ITEMS) ×3 IMPLANT
PAD PREP 24X41 OB/GYN DISP (PERSONAL CARE ITEMS) ×3 IMPLANT
SCISSORS METZENBAUM CVD 33 (INSTRUMENTS) IMPLANT
SCRUB CHG 4% DYNA-HEX 4OZ (MISCELLANEOUS) ×2 IMPLANT
SET TUBE SMOKE EVAC HIGH FLOW (TUBING) ×3 IMPLANT
SLEEVE ENDOPATH XCEL 5M (ENDOMECHANICALS) ×3 IMPLANT
SOL PREP PVP 2OZ (MISCELLANEOUS) ×3
SOL SCRUB PVP POV-IOD 4OZ 7.5% (MISCELLANEOUS) ×3
SOLUTION PREP PVP 2OZ (MISCELLANEOUS) ×2 IMPLANT
SOLUTION SCRB POV-IOD 4OZ 7.5% (MISCELLANEOUS) ×2 IMPLANT
SURGILUBE 2OZ TUBE FLIPTOP (MISCELLANEOUS) ×3 IMPLANT
SUT MNCRL 4-0 (SUTURE) ×1
SUT MNCRL 4-0 27XMFL (SUTURE) ×2
SUT VIC AB 0 CT1 36 (SUTURE) ×3 IMPLANT
SUT VIC AB 2-0 UR6 27 (SUTURE) ×3 IMPLANT
SUTURE MNCRL 4-0 27XMF (SUTURE) ×2 IMPLANT
SYR 50ML LL SCALE MARK (SYRINGE) ×3 IMPLANT
TROCAR ENDO BLADELESS 11MM (ENDOMECHANICALS) ×3 IMPLANT
TROCAR XCEL NON-BLD 5MMX100MML (ENDOMECHANICALS) ×3 IMPLANT
WATER STERILE IRR 500ML POUR (IV SOLUTION) ×3 IMPLANT

## 2022-05-10 NOTE — Anesthesia Postprocedure Evaluation (Signed)
Anesthesia Post Note  Patient: Itzelle Gains  Procedure(s) Performed: DIAGNOSTIC LAPAROSCOPY WITH REMOVAL OF ECTOPIC PREGNANCY (Right: Abdomen) LAPAROSCOPIC UNILATERAL SALPINGECTOMY (Right)  Patient location during evaluation: PACU Anesthesia Type: General Level of consciousness: awake and alert Pain management: pain level controlled Vital Signs Assessment: post-procedure vital signs reviewed and stable Respiratory status: spontaneous breathing, nonlabored ventilation, respiratory function stable and patient connected to nasal cannula oxygen Cardiovascular status: blood pressure returned to baseline and stable Postop Assessment: no apparent nausea or vomiting Anesthetic complications: no   No notable events documented.   Last Vitals:  Vitals:   05/10/22 0515 05/10/22 0530  BP: (!) 101/54 (!) 93/52  Pulse: 95 92  Resp: 14 18  Temp:  37.1 C  SpO2: 99% 98%    Last Pain:  Vitals:   05/10/22 0545  TempSrc:   PainSc: 8                  Lenard Simmer

## 2022-05-10 NOTE — Anesthesia Procedure Notes (Signed)
Procedure Name: Intubation Date/Time: 05/10/2022 3:36 AM  Performed by: Waldo Laine, CRNAPre-anesthesia Checklist: Patient identified, Patient being monitored, Timeout performed, Emergency Drugs available and Suction available Patient Re-evaluated:Patient Re-evaluated prior to induction Oxygen Delivery Method: Circle system utilized Preoxygenation: Pre-oxygenation with 100% oxygen Induction Type: IV induction, Rapid sequence and Cricoid Pressure applied Laryngoscope Size: 3 and McGraph Grade View: Grade I Tube type: Oral Tube size: 7.0 mm Number of attempts: 1 Airway Equipment and Method: Stylet Placement Confirmation: ETT inserted through vocal cords under direct vision, positive ETCO2 and breath sounds checked- equal and bilateral Secured at: 21 cm Tube secured with: Tape Dental Injury: Teeth and Oropharynx as per pre-operative assessment

## 2022-05-10 NOTE — Anesthesia Preprocedure Evaluation (Signed)
Anesthesia Evaluation  Patient identified by MRN, date of birth, ID band Patient awake    Reviewed: Allergy & Precautions, H&P , NPO status , Patient's Chart, lab work & pertinent test results, reviewed documented beta blocker date and time   History of Anesthesia Complications Negative for: history of anesthetic complications  Airway Mallampati: II  TM Distance: >3 FB Neck ROM: full    Dental  (+) Dental Advidsory Given, Caps, Teeth Intact   Pulmonary asthma ,    Pulmonary exam normal breath sounds clear to auscultation       Cardiovascular Exercise Tolerance: Good negative cardio ROS Normal cardiovascular exam Rhythm:regular Rate:Normal     Neuro/Psych negative neurological ROS  negative psych ROS   GI/Hepatic Neg liver ROS, GERD  ,  Endo/Other  neg diabetesMorbid obesity  Renal/GU negative Renal ROS  negative genitourinary   Musculoskeletal   Abdominal   Peds  Hematology negative hematology ROS (+)   Anesthesia Other Findings Past Medical History: No date: No known health problems   Reproductive/Obstetrics (+) Pregnancy (ectopic)                             Anesthesia Physical Anesthesia Plan  ASA: 2 and emergent  Anesthesia Plan: General   Post-op Pain Management:    Induction: Intravenous, Rapid sequence and Cricoid pressure planned  PONV Risk Score and Plan: 3 and Dexamethasone, Midazolam and Treatment may vary due to age or medical condition  Airway Management Planned: Oral ETT  Additional Equipment:   Intra-op Plan:   Post-operative Plan: Extubation in OR  Informed Consent: I have reviewed the patients History and Physical, chart, labs and discussed the procedure including the risks, benefits and alternatives for the proposed anesthesia with the patient or authorized representative who has indicated his/her understanding and acceptance.     Dental Advisory  Given  Plan Discussed with: Anesthesiologist, CRNA and Surgeon  Anesthesia Plan Comments:         Anesthesia Quick Evaluation

## 2022-05-10 NOTE — Transfer of Care (Signed)
Immediate Anesthesia Transfer of Care Note  Patient: Amanda Summers  Procedure(s) Performed: DIAGNOSTIC LAPAROSCOPY WITH REMOVAL OF ECTOPIC PREGNANCY (Right: Abdomen) LAPAROSCOPIC UNILATERAL SALPINGECTOMY (Right)  Patient Location: PACU  Anesthesia Type:General  Level of Consciousness: sedated and patient cooperative  Airway & Oxygen Therapy: Patient Spontanous Breathing and Patient connected to face mask oxygen  Post-op Assessment: Post -op Vital signs reviewed and stable  Post vital signs: Reviewed and stable  Last Vitals:  Vitals Value Taken Time  BP    Temp    Pulse 98 05/10/22 0457  Resp 13 05/10/22 0457  SpO2 100 % 05/10/22 0457  Vitals shown include unvalidated device data.  Last Pain:  Vitals:   05/10/22 0231  TempSrc:   PainSc: 8          Complications: No notable events documented.

## 2022-05-10 NOTE — Op Note (Signed)
  Operative Note    Name: Amanda Summers  Date of Service: 05/10/2022  DOB: 1996-04-06  MRN: [id number]   Pre-Operative Diagnosis: Rupture right tubal ectopic pregnancy causing hemoperitoneum [O00.01, K66.1]  Post-Operative Diagnosis: Rupture right tubal ectopic pregnancy causing hemoperitoneum [O00.01, K66.1]  Procedures:  Laparoscopic right salpingectomy, removal of ectopic pregnancy, evacuation of hemoperitoneum  Primary Surgeon: Thomasene Mohair, MD   EBL: 100 mL (nearly all in hemoperitoneum discovered upon abdominal entry)  IVF: 800 mL   Urine output: 450 mL  Specimens: right fallopian tube with ectopic pregnancy  Drains: none  Complications: None   Disposition: PACU   Condition: Stable   Findings:  1) enlarged fallopian tube with ectopic pregnancy near the fimbriated end. 2) hemoperitoneum, mostly in pelvis, about 100 mL 3) normal appearing uterus, left fallopian tube and bilateral ovaries  Procedure Summary:  The patient was taken to the operating room where general anesthesia was administered and found to be adequate. She was placed in the dorsal supine lithotomy position in [place] stirrups and prepped and draped in usual sterile fashion. After a timeout was called an indwelling catheter was placed in her bladder. A sponge-on-a-stick was placed in her vagina for uterine manipulation.   Attention was turned to the abdomen where, after injection of local anesthetic, a 5 mm infraumbilical incision was made with the scalpel. Entry into the abdomen was obtained via Optiview trocar technique (a blunt entry technique with camera visualization through the obturator upon entry). Verification of entry into the abdomen was obtained using opening pressures. The abdomen was insufflated with CO2. The camera was introduced through the trocar with verification of atraumatic entry. A left lower quadrant 5 mm port site and an 11 mm suprapubic port site was created under direct  intra-abdominal camera visualization without difficulty. A survey of the pelvis was undertaken with the above-noted findings. In a medial-to-lateral fashion the LigaSure device was utilized to first cauterize and transect the fallopian tube (at the right cornu) and following the mesosalpinx, which was cauterized and transected, the fallopian tube was liberated from the right adnexa. Care was taken not to transect the right infundibulopelvic ligament.  The hemoperitoneum was evacuated.  The specimen was removed through the 11 mm port site intact through a specimen retrieval bag. The intra-abdominal pressure was lowered to 5 mmHg and ongoing hemostasis was verified along the pedicle.  The 11 mm trocar was removed and the fascia was re-approximated using a single 0 vicryl stitch. The abdomen was emptied of CO2 and the remaining trocars were removed.  The skin was closed at all the incision sites using 4-0 monocryl and surgical skin glue was used to reinforce the skin closure.    The sponge-on-a-stick was removed from her vagina and the catheter was removed.    The patient tolerated the procedure well.  Sponge, lap, needle, and instrument counts were correct x 2.  VTE prophylaxis: SCDs. Antibiotic prophylaxis: None indicated and none given. She was awakened in the operating room and was taken to the PACU in stable condition.   Thomasene Mohair, MD 05/10/2022 5:04 AM

## 2022-05-10 NOTE — ED Provider Notes (Signed)
[facility] Provider Note    Event Date/Time   First MD Initiated Contact with Patient 05/10/22 [phone]     (approximate)   History   Abdominal Pain   HPI  Amanda Summers is a 26 y.o. female G1 P0 with a known right-sided ectopic pregnancy which was treated about 2 weeks ago with methotrexate.  She presents tonight for worsening abdominal pain over the last several days.  She has not had any vaginal bleeding for about a week.  No nausea or vomiting.  Pain worse with moving around.  No dizziness or lightheadedness.  Pain is severe.     Physical Exam   Triage Vital Signs: ED Triage Vitals  Enc Vitals Group     BP 05/09/22 2016 110/75     Pulse Rate 05/09/22 2016 93     Resp 05/09/22 2016 18     Temp 05/09/22 2016 99.9 F (37.7 C)     Temp Source 05/09/22 2016 Oral     SpO2 05/09/22 2016 100 %     Weight 05/09/22 2017 113.4 kg (250 lb)     Height 05/09/22 2017 1.702 m (5\' 7" )     Head Circumference --      Peak Flow --      Pain Score 05/09/22 2017 7     Pain Loc --      Pain Edu? --      Excl. in GC? --     Most recent vital signs: Vitals:   05/10/22 0600 05/10/22 0630  BP: (!) 91/47 102/60  Pulse: 90 90  Resp: 13   Temp:  97.8 F (36.6 C)  SpO2: 98% 98%     General: Awake, generally well-appearing. CV:  Good peripheral perfusion.  Mild tachycardia, regular rhythm. Resp:  Normal effort.  Speaking easily and in full sentences. Abd:  No distention.  Generalized tenderness to palpation throughout, mild, no rebound or guarding.   ED Results / Procedures / Treatments   Labs (all labs ordered are listed, but only abnormal results are displayed) Labs Reviewed  CBC - Abnormal; Notable for the following components:      Result Value   Hemoglobin 10.7 (*)    HCT 35.4 (*)    MCV 76.0 (*)    MCH 23.0 (*)    RDW 15.7 (*)    All other components within normal limits  HCG, QUANTITATIVE, PREGNANCY - Abnormal; Notable for the following  components:   hCG, Beta Chain, Quant, S 816 (*)    All other components within normal limits  URINALYSIS, ROUTINE W REFLEX MICROSCOPIC - Abnormal; Notable for the following components:   Color, Urine YELLOW (*)    APPearance HAZY (*)    All other components within normal limits  COMPREHENSIVE METABOLIC PANEL  SAMPLE TO BLOOD BANK  SURGICAL PATHOLOGY      RADIOLOGY I viewed and interpreted the patient's ultrasound and can appreciate that there is moderate fluid in the pelvis.  The radiologist called and we discussed the case by phone.  She has what appears to be a ruptured ectopic pregnancy.     PROCEDURES:  Critical Care performed: Yes, see critical care procedure note(s)  .Critical Care  Performed by: 07/11/22, MD Authorized by: Loleta Rose, MD   Critical care provider statement:    Critical care time (minutes):  30   Critical care time was exclusive of:  Separately billable procedures and treating other patients   Critical care was necessary to  treat or prevent imminent or life-threatening deterioration of the following conditions: Ruptured ectopic pregnancy with hemoperitoneum.   Critical care was time spent personally by me on the following activities:  Development of treatment plan with patient or surrogate, evaluation of patient's response to treatment, examination of patient, obtaining history from patient or surrogate, ordering and performing treatments and interventions, ordering and review of laboratory studies, ordering and review of radiographic studies, pulse oximetry, re-evaluation of patient's condition and review of old charts .1-3 Lead EKG Interpretation  Performed by: Loleta Rose, MD Authorized by: Loleta Rose, MD     Interpretation: abnormal     ECG rate:  105   ECG rate assessment: tachycardic     Rhythm: sinus tachycardia     Ectopy: none     Conduction: normal      MEDICATIONS ORDERED IN ED: Medications  lactated ringers infusion (  Intravenous New Bag/Given 05/10/22 0514)  fentaNYL (SUBLIMAZE) injection 25-50 mcg (25 mcg Intravenous Given 05/10/22 0535)  promethazine (PHENERGAN) injection 6.25-12.5 mg (has no administration in time range)  morphine (PF) 4 MG/ML injection 4 mg (4 mg Intravenous Given 05/10/22 0132)  ondansetron (ZOFRAN) injection 4 mg (4 mg Intravenous Given 05/10/22 0119)  lactated ringers bolus 1,000 mL (0 mLs Intravenous Stopped 05/10/22 0232)  fentaNYL (SUBLIMAZE) 100 MCG/2ML injection (  Duplicate 05/10/22 0514)  ibuprofen (ADVIL) tablet 600 mg (0 mg Oral Duplicate 05/10/22 0600)  HYDROcodone-acetaminophen (NORCO/VICODIN) 5-325 MG per tablet 1 tablet (1 tablet Oral Given 05/10/22 0559)  HYDROcodone-acetaminophen (NORCO/VICODIN) 5-325 MG per tablet (  Duplicate 05/10/22 0600)  ibuprofen (ADVIL) 600 MG tablet (600 mg  Given 05/10/22 0558)  promethazine (PHENERGAN) 25 MG/ML injection (  Given 05/10/22 0600)  sodium chloride flush 0.9 % injection (  Given 05/10/22 0629)     IMPRESSION / MDM / ASSESSMENT AND PLAN / ED COURSE  I reviewed the triage vital signs and the nursing notes.                              Differential diagnosis includes, but is not limited to, ruptured ectopic pregnancy, persistent nonviable pregnancy (ectopic), STD/PID/TOA.  Patient's presentation is most consistent with acute presentation with potential threat to life or bodily function.  Labs/studies ordered include CBC, comprehensive metabolic panel, hCG, urinalysis, pelvic ultrasound.  Radiology called me with the results of the ultrasound suggesting ruptured ectopic pregnancy and then increase of size of the pregnancy compared to 2 weeks ago.  Hemoglobin is stable, no leukocytosis, normal BMP, hCG only 816.  No evidence of urinary tract infection.  I will consult OB/GYN for [facility] clinic for emergent surgical intervention.  I ordered LR 1 L IV bolus, morphine 4 mg IV, and Zofran 4 mg IV.  Patient understands the diagnosis and need for  intervention.   Clinical Course as of 05/10/22 0848  Tue May 10, 2022  0116 Discussed case by phone with Donato Schultz, CNM, with [facility].  She recommended I call Dr. Jean Rosenthal directly.  Paging him now. [CF]  0121 I consulted Dr. Jean Rosenthal by phone.  We discussed the case and he will come to the emergency department to see and evaluate the patient.  Anticipate surgical intervention. [CF]    Clinical Course User Index [CF] Loleta Rose, MD     FINAL CLINICAL IMPRESSION(S) / ED DIAGNOSES   Final diagnoses:  Ruptured ectopic pregnancy     Rx / DC Orders   ED Discharge Orders  Ordered    Diet general        05/10/22 0519    Increase activity slowly        05/10/22 0519    May shower / Bathe        05/10/22 0519    Driving Restrictions       Comments: No driving while taking narcotic pain medication   05/10/22 0519    Sexual Activity Restrictions       Comments: No sexual activity for 4 weeks   05/10/22 0519    Call MD for:  temperature >100.4        05/10/22 0519    Call MD for:  persistant nausea and vomiting        05/10/22 0519    Call MD for:  severe uncontrolled pain        05/10/22 0519    Call MD for:  redness, tenderness, or signs of infection (pain, swelling, redness, odor or green/yellow discharge around incision site)        05/10/22 0519    Call MD for:  difficulty breathing, headache or visual disturbances        05/10/22 0519    Call MD for:  hives        05/10/22 0519    Call MD for:  persistant dizziness or light-headedness        05/10/22 0519    Call MD for:  extreme fatigue        05/10/22 0519    Call MD for:       Comments: Call for vaginal bleeding such that you are soaking 1 pad or greater for 1 hour or more.   05/10/22 0519    ibuprofen (ADVIL) 600 MG tablet  Every 6 hours        05/10/22 0519    HYDROcodone-acetaminophen (NORCO/VICODIN) 5-325 MG tablet  Every 6 hours PRN        05/10/22 6295             Note:  This document  was prepared using Dragon voice recognition software and may include unintentional dictation errors.   Loleta Rose, MD 05/10/22 [phone]

## 2022-05-10 NOTE — H&P (Signed)
GYNECOLOGY ADMISSION HISTORY AND PHYSICAL NOTE    Attending Provider: Loleta Rose, MD   Amanda Summers [id number] 05/10/2022 2:50 AM    Chief Complaint:   Amanda Summers is a 26 y.o. G1P0010 premenopausal female seen at the request of Dr. York Cerise for evaluation of ruptured right ectopic pregnancy.    History of Present Ilness:  She was seen in the ER on 04/24/22 and was diagnosed with a right ectopic pregnancy and was treated with methotrexate. She had appropriately dropping quantitative hCG levels.  However, 2 days ago she began having sharp, intermittent suprapubic and right lower quadrant abdominal pain.  Her initial presenting symptom was vaginal bleeding in pregnancy. However, she really hasn't had bleeding in a week.  She mainly presented last night due to pain that was increasing in intensity since two days ago.  Nothing seems to make the pain better or worse.  The pain doesn't radiate. She has no associated symptoms.   Past Medical History:  Diagnosis Date   No known health problems    Past Surgical History:  Procedure Laterality Date   FOOT SURGERY     Allergies: No Known Allergies  Prior to Admission medications   Denies   Social History:  She  reports that she has never smoked. She has never used smokeless tobacco. She reports current alcohol use. She reports that she does not use drugs.  Family History:  The patient denies a family history of breat and ovarian cancer.  She states that her family his healthy.  Review of Systems:   Review of Systems  Constitutional: Negative.   HENT: Negative.    Eyes: Negative.   Respiratory: Negative.    Cardiovascular: Negative.   Gastrointestinal:  Positive for abdominal pain. Negative for blood in stool, heartburn, melena, nausea and vomiting.  Genitourinary: Negative.   Musculoskeletal: Negative.   Skin: Negative.   Neurological: Negative.   Psychiatric/Behavioral: Negative.       Objective    BP 117/72   Pulse (!) 102    Temp 99.9 F (37.7 C) (Oral)   Resp 19   Ht 5\' 7"  (1.702 m)   Wt 113.4 kg   LMP 03/20/2022 (Exact Date) Comment: recent ectopic  SpO2 100%   Breastfeeding Unknown   BMI 39.16 kg/m  Physical Exam Constitutional:      General: She is not in acute distress.    Appearance: Normal appearance. She is well-developed.  HENT:     Head: Normocephalic and atraumatic.  Eyes:     General: No scleral icterus.    Conjunctiva/sclera: Conjunctivae normal.  Cardiovascular:     Rate and Rhythm: Normal rate and regular rhythm.     Heart sounds: No murmur heard.    No friction rub. No gallop.  Pulmonary:     Effort: Pulmonary effort is normal. No respiratory distress.     Breath sounds: Normal breath sounds. No wheezing or rales.  Abdominal:     General: Bowel sounds are normal. There is no distension.     Palpations: Abdomen is soft. There is no mass.     Tenderness: There is abdominal tenderness (mild-to-moderate in RLQ). There is no guarding or rebound.  Musculoskeletal:        General: Normal range of motion.     Cervical back: Normal range of motion and neck supple.  Neurological:     General: No focal deficit present.     Mental Status: She is alert and oriented to person, place, and time.  Cranial Nerves: No cranial nerve deficit.  Skin:    General: Skin is warm and dry.     Findings: No erythema.  Psychiatric:        Mood and Affect: Mood normal.        Behavior: Behavior normal.        Judgment: Judgment normal.      Laboratory Results:   Lab Results  Component Value Date   WBC 5.0 05/09/2022   RBC 4.66 05/09/2022   HGB 10.7 (L) 05/09/2022   HCT 35.4 (L) 05/09/2022   PLT 268 05/09/2022   NA 137 05/09/2022   K 3.6 05/09/2022   CREATININE 0.87 05/09/2022   Lab Results  Component Value Date   PREGTESTUR POSITIVE (A) 04/24/2022    Imaging Results:  US OB LESS THAN 14 WEEKS WITH OB TRANSVAGINAL  Result Date: 05/10/2022 CLINICAL DATA:  Increased right-sided pain.  Recent diagnosis of right adnexal ectopic pregnancy status post methotrexate. EXAM: OBSTETRIC <14 WK [place] AND TRANSVAGINAL OB US TECHNIQUE: Both transabdominal and transvaginal ultrasound examinations were performed for complete evaluation of the gestation as well as the maternal uterus, adnexal regions, and pelvic cul-de-sac. Transvaginal technique was performed to assess early pregnancy. COMPARISON:  Obstetric ultrasound 04/27/2022 FINDINGS: Intrauterine gestational sac: None Yolk sac:  Not Visualized. Embryo:  Not Visualized. Maternal uterus/adnexae: No intrauterine pregnancy. Anteverted uterus. Thin endometrium at 4 mm. Right adnexal mass is again seen measuring 1.6 x 1.2 x 1.6 cm, this previously measured 1.2 x 1.2 x 1.2 cm. There are 2 prominent follicles/cysts in the right ovary, 1 of which has peripheral vascularity and may represent a corpus luteum. Ovarian blood flow is demonstrated. The left ovary is normal. There is a moderate amount of free fluid that was not seen on prior exam. Some of this fluid is complex. IMPRESSION: 1. Persistent right ectopic pregnancy, slightly increased in size from exam 2 weeks ago. There is no a moderate amount of free fluid in the pelvis is mildly complex, suspicious for rupture. 2. No intrauterine gestation. Critical Value/emergent results were called by telephone at the time of interpretation on 05/10/2022 at 1:07 am to provider Corliss Parish, who verbally acknowledged these results. Electronically Signed   By: Narda Rutherford M.D.   On: 05/10/2022 01:08      Assessment & Plan   Amanda Summers is a 26 y.o. G1P0010 premenopausal female who has been followed for ectopic pregnancy who appears to have a rupture of the fallopian tube.  The pelvic ultrasound shows an interval growth of the suspected ectopic from about 1.2 cm to 1.6 cm and she has more free, complex fluid in her pelvis.  This is strongly concerning for failure of methotrexate therapy and rupture of the fallopian  tube. I discussed with her that her overall clinical picture is concerning for a rupture of her ectopic pregnancy, though the free fluid could be coming from the end of the fallopian tube.  The interval increase in size of lesion on the right side could represent bleeding instead of a growth of the ectopic pregnancy. Even still, I might have to remove the right fallopian tube in order to be sure that the pregnancy has been completely removed.  She voiced understanding and agreement with the plan.   Plan:  1.  To the OR for diagnostic laparoscopy and likely removal of the right fallopian tube and ectopic pregnancy.   2.  Informed consent reviewed and signed with patient. All questions answered.   Thomasene Mohair,  MD 05/10/2022 2:50 AM

## 2022-05-11 ENCOUNTER — Encounter: Payer: Self-pay | Admitting: Obstetrics and Gynecology

## 2022-05-13 LAB — SURGICAL PATHOLOGY

## 2022-05-20 ENCOUNTER — Ambulatory Visit: Admit: 2022-05-20 | Discharge status: Against Medical Advice | Payer: Managed Care, Other (non HMO)

## 2022-06-02 ENCOUNTER — Encounter: Payer: Managed Care, Other (non HMO) | Admitting: Obstetrics and Gynecology

## 2022-06-03 ENCOUNTER — Encounter: Payer: Managed Care, Other (non HMO) | Admitting: Obstetrics and Gynecology

## 2022-12-31 ENCOUNTER — Other Ambulatory Visit: Payer: Self-pay

## 2022-12-31 ENCOUNTER — Emergency Department
Admission: EM | Admit: 2022-12-31 | Discharge: 2022-12-31 | Disposition: A | Discharge status: Home / Self Care | Payer: Managed Care, Other (non HMO) | Attending: Emergency Medicine | Admitting: Emergency Medicine

## 2022-12-31 ENCOUNTER — Emergency Department: Payer: Managed Care, Other (non HMO)

## 2022-12-31 DIAGNOSIS — R0789 Other chest pain: Secondary | ICD-10-CM | POA: Diagnosis not present

## 2022-12-31 DIAGNOSIS — R079 Chest pain, unspecified: Secondary | ICD-10-CM

## 2022-12-31 LAB — CBC
HCT: 37.2 % (ref 36.0–46.0)
Hemoglobin: 11.4 g/dL — ABNORMAL LOW (ref 12.0–15.0)
MCH: 23.4 pg — ABNORMAL LOW (ref 26.0–34.0)
MCHC: 30.6 g/dL (ref 30.0–36.0)
MCV: 76.2 fL — ABNORMAL LOW (ref 80.0–100.0)
Platelets: 263 10*3/uL (ref 150–400)
RBC: 4.88 MIL/uL (ref 3.87–5.11)
RDW: 15.8 % — ABNORMAL HIGH (ref 11.5–15.5)
WBC: 4.4 10*3/uL (ref 4.0–10.5)
nRBC: 0 % (ref 0.0–0.2)

## 2022-12-31 LAB — BASIC METABOLIC PANEL
Anion gap: 6 (ref 5–15)
BUN: 12 mg/dL (ref 6–20)
CO2: 25 mmol/L (ref 22–32)
Calcium: 9.1 mg/dL (ref 8.9–10.3)
Chloride: 103 mmol/L (ref 98–111)
Creatinine, Ser: 0.89 mg/dL (ref 0.44–1.00)
GFR, Estimated: >60 mL/min (ref >60)
Glucose, Bld: 98 mg/dL (ref 70–99)
Potassium: 3.7 mmol/L (ref 3.5–5.1)
Sodium: 134 mmol/L — ABNORMAL LOW (ref 135–145)

## 2022-12-31 LAB — TROPONIN I (HIGH SENSITIVITY)
Troponin I (High Sensitivity): <2 ng/L (ref <18)
Troponin I (High Sensitivity): <2 ng/L (ref <18)

## 2022-12-31 LAB — HEPATIC FUNCTION PANEL
ALT: 18 U/L (ref 0–44)
AST: 23 U/L (ref 15–41)
Albumin: 3.7 g/dL (ref 3.5–5.0)
Alkaline Phosphatase: 56 U/L (ref 38–126)
Bilirubin, Direct: 0.1 mg/dL (ref 0.0–0.2)
Indirect Bilirubin: 0.6 mg/dL (ref 0.3–0.9)
Total Bilirubin: 0.7 mg/dL (ref 0.3–1.2)
Total Protein: 7.5 g/dL (ref 6.5–8.1)

## 2022-12-31 LAB — LIPASE, BLOOD: Lipase: 32 U/L (ref 11–51)

## 2022-12-31 LAB — POC URINE PREG, ED: Preg Test, Ur: NEGATIVE

## 2022-12-31 MED ORDER — NAPROXEN 500 MG PO TABS: 500.0000 mg | ORAL_TABLET | Freq: Two times a day (BID) | ORAL | 0 refills | Status: AC

## 2022-12-31 NOTE — ED Provider Notes (Signed)
[facility] Provider Note  Patient Contact: 3:38 PM (approximate)   History   Chest Pain   HPI  Amanda Summers is a 27 y.o. female with a history of prior ectopic pregnancy, presents to the emergency department with right anterior chest pain over the past 4 months.  Patient states that her pain can be provoked with movement of her right extremity overhead and sometimes with movement in general.  She describes the pain as sharp and that it seems to come and go.  Is not associated with shortness of breath or chest tightness.  Patient has no personal history of any cardiac or pulmonary issues.  She denies recent falls or mechanisms of trauma.  She does not use exogenous hormones.  She denies daily smoking.  No pleuritic nature to the pain.  No associated cough or leg swelling.  No nausea, vomiting or abdominal pain.      Physical Exam   Triage Vital Signs: ED Triage Vitals  Enc Vitals Group     BP 12/31/22 1504 122/75     Pulse Rate 12/31/22 1504 92     Resp 12/31/22 1504 18     Temp 12/31/22 1504 98.4 F (36.9 C)     Temp Source 12/31/22 1504 Oral     SpO2 12/31/22 1504 99 %     Weight 12/31/22 1504 230 lb (104.3 kg)     Height --      Head Circumference --      Peak Flow --      Pain Score 12/31/22 1502 6     Pain Loc --      Pain Edu? --      Excl. in [place]? --     Most recent vital signs: Vitals:   12/31/22 1504  BP: 122/75  Pulse: 92  Resp: 18  Temp: 98.4 F (36.9 C)  SpO2: 99%     General: Alert and in no acute distress. Eyes:  PERRL. EOMI. Head: No acute traumatic finding ENT:      Nose: No congestion/rhinnorhea.      Mouth/Throat: Mucous membranes are moist.  Neck: No stridor. No cervical spine tenderness to palpation. Cardiovascular:  Good peripheral perfusion. Respiratory: Normal respiratory effort without tachypnea or retractions. Lungs CTAB. Good air entry to the bases with no decreased or absent breath  sounds. Gastrointestinal: Bowel sounds 4 quadrants. Soft and nontender to palpation. No guarding or rigidity. No palpable masses. No distention. No CVA tenderness. Musculoskeletal: Full range of motion to all extremities.  Neurologic:  No gross focal neurologic deficits are appreciated.  Skin:   No rash noted   ED Results / Procedures / Treatments   Labs (all labs ordered are listed, but only abnormal results are displayed) Labs Reviewed  BASIC METABOLIC PANEL - Abnormal; Notable for the following components:      Result Value   Sodium 134 (*)    All other components within normal limits  CBC - Abnormal; Notable for the following components:   Hemoglobin 11.4 (*)    MCV 76.2 (*)    MCH 23.4 (*)    RDW 15.8 (*)    All other components within normal limits  LIPASE, BLOOD  HEPATIC FUNCTION PANEL  POC URINE PREG, ED  TROPONIN I (HIGH SENSITIVITY)  TROPONIN I (HIGH SENSITIVITY)  TROPONIN I (HIGH SENSITIVITY)     EKG  Normal sinus rhythm without ST segment elevation or other apparent arrhythmia.   RADIOLOGY  I personally viewed and  evaluated these images as part of my medical decision making, as well as reviewing the written report by the radiologist.  ED Provider Interpretation: No acute abnormality on chest x-ray.   PROCEDURES:  Critical Care performed: No  Procedures   MEDICATIONS ORDERED IN ED: Medications - No data to display   IMPRESSION / MDM / [place] / ED COURSE  I reviewed the triage vital signs and the nursing notes.                              Assessment and plan Right anterior chest pain 27 year old female with unremarkable past medical history, presents to the emergency department with intermittent right anterior chest pain for the past 4 months.  Vital signs are reassuring at triage.  On exam, patient was alert, active and nontoxic-appearing with no increased work of breathing or other adventitious lung sounds auscultated.  His  pain did not appear reproducible to palpation.  Differential diagnosis included costochondritis, muscle strain, pneumonia, GERD, STEMI, NSTEMI...  Chest x-ray shows no acute abnormality.  EKG indicates normal sinus rhythm without ST segment elevation or other apparent arrhythmia.  Delta troponin within range.  Hepatic function panel and lipase within range.  Given that pain is exasperated with movement of the right upper extremity, I suspect a musculoskeletal source for discomfort. Recommended naproxen twice daily for the next ten days.  I recommended follow-up with cardiology as an outpatient.  Return precautions were given to return for new or worsening symptoms.     FINAL CLINICAL IMPRESSION(S) / ED DIAGNOSES   Final diagnoses:  Nonspecific chest pain     Rx / DC Orders   ED Discharge Orders          Ordered    naproxen (NAPROSYN) 500 MG tablet  2 times daily with meals        12/31/22 1728             Note:  This document was prepared using Dragon voice recognition software and may include unintentional dictation errors.   Vallarie Mare Irwin, PA-C 12/31/22 1729    Nathaniel Man, MD 12/31/22 2012

## 2022-12-31 NOTE — ED Triage Notes (Signed)
Pt c/o sharp chest pain for the past 4 months.Hurts to take a deep breath. Denies any cough or sob.

## 2022-12-31 NOTE — Discharge Instructions (Signed)
Please make follow-up appointment with cardiology.

## 2023-01-26 ENCOUNTER — Emergency Department
Admission: EM | Admit: 2023-01-26 | Discharge: 2023-01-27 | Disposition: A | Discharge status: Home / Self Care | Payer: Managed Care, Other (non HMO) | Attending: Emergency Medicine | Admitting: Emergency Medicine

## 2023-01-26 ENCOUNTER — Emergency Department: Payer: Managed Care, Other (non HMO)

## 2023-01-26 DIAGNOSIS — Z3A00 Weeks of gestation of pregnancy not specified: Secondary | ICD-10-CM | POA: Insufficient documentation

## 2023-01-26 DIAGNOSIS — O26851 Spotting complicating pregnancy, first trimester: Secondary | ICD-10-CM | POA: Diagnosis present

## 2023-01-26 DIAGNOSIS — O200 Threatened abortion: Secondary | ICD-10-CM

## 2023-01-26 HISTORY — DX: Unspecified ectopic pregnancy without intrauterine pregnancy: O00.90

## 2023-01-26 LAB — URINALYSIS, ROUTINE W REFLEX MICROSCOPIC
Bilirubin Urine: NEGATIVE
Glucose, UA: NEGATIVE mg/dL
Hgb urine dipstick: NEGATIVE
Ketones, ur: NEGATIVE mg/dL
Leukocytes,Ua: NEGATIVE
Nitrite: NEGATIVE
Protein, ur: NEGATIVE mg/dL
Specific Gravity, Urine: 1.015 (ref 1.005–1.030)
pH: 7 (ref 5.0–8.0)

## 2023-01-26 LAB — POC URINE PREG, ED
Preg Test, Ur: NEGATIVE
Preg Test, Ur: NEGATIVE

## 2023-01-26 LAB — BASIC METABOLIC PANEL
Anion gap: 9 (ref 5–15)
BUN: 13 mg/dL (ref 6–20)
CO2: 24 mmol/L (ref 22–32)
Calcium: 9.2 mg/dL (ref 8.9–10.3)
Chloride: 101 mmol/L (ref 98–111)
Creatinine, Ser: 0.82 mg/dL (ref 0.44–1.00)
GFR, Estimated: >60 mL/min (ref >60)
Glucose, Bld: 95 mg/dL (ref 70–99)
Potassium: 3.7 mmol/L (ref 3.5–5.1)
Sodium: 134 mmol/L — ABNORMAL LOW (ref 135–145)

## 2023-01-26 LAB — TYPE AND SCREEN
ABO/RH(D): A POS
Antibody Screen: NEGATIVE

## 2023-01-26 LAB — CBC
HCT: 37 % (ref 36.0–46.0)
Hemoglobin: 11.5 g/dL — ABNORMAL LOW (ref 12.0–15.0)
MCH: 23.5 pg — ABNORMAL LOW (ref 26.0–34.0)
MCHC: 31.1 g/dL (ref 30.0–36.0)
MCV: 75.7 fL — ABNORMAL LOW (ref 80.0–100.0)
Platelets: 288 10*3/uL (ref 150–400)
RBC: 4.89 MIL/uL (ref 3.87–5.11)
RDW: 15.9 % — ABNORMAL HIGH (ref 11.5–15.5)
WBC: 7.2 10*3/uL (ref 4.0–10.5)
nRBC: 0 % (ref 0.0–0.2)

## 2023-01-26 LAB — HCG, QUANTITATIVE, PREGNANCY: hCG, Beta Chain, Quant, S: 128 m[IU]/mL — ABNORMAL HIGH (ref <5)

## 2023-01-26 NOTE — ED Triage Notes (Signed)
Pt here POV for vaginal bleeding that started today "spotting", reports several home pregnancy test "Positive". Lower abd cramping, pt reports last year had ectopic pregnancy. G2 PO. Last menstrual Dec 09, 2022.

## 2023-01-26 NOTE — ED Provider Notes (Signed)
   [facility] Provider Note    Event Date/Time   First MD Initiated Contact with Patient 01/26/23 2141     (approximate)  History   Chief Complaint: Vaginal Bleeding  HPI  Amanda Summers is a 27 y.o. female with a past medical history of an ectopic pregnancy previously presents to the emergency department for vaginal spotting and a positive home pregnancy test.  According to the patient she has a history of a prior ectopic pregnancy, states she was approximately 1 week late on her menstrual cycle so she took a pregnancy test earlier today that was positive, this evening she noted some mild spotting she was concerned so she came to the emergency department for evaluation.  States very minimal lower abdominal cramping.  Physical Exam   Triage Vital Signs: ED Triage Vitals  Enc Vitals Group     BP 01/26/23 1955 135/75     Pulse Rate 01/26/23 1955 (!) 112     Resp 01/26/23 1955 16     Temp 01/26/23 1955 98.3 F (36.8 C)     Temp Source 01/26/23 1955 Oral     SpO2 01/26/23 1955 99 %     Weight 01/26/23 1956 240 lb (108.9 kg)     Height 01/26/23 1956 5\' 7"  (1.702 m)     Head Circumference --      Peak Flow --      Pain Score 01/26/23 1956 4     Pain Loc --      Pain Edu? --      Excl. in [place]? --     Most recent vital signs: Vitals:   01/26/23 1955  BP: 135/75  Pulse: (!) 112  Resp: 16  Temp: 98.3 F (36.8 C)  SpO2: 99%    General: Awake, no distress.  CV:  Good peripheral perfusion.  Regular rate and rhythm  Resp:  Normal effort.  Equal breath sounds bilaterally.  Abd:  No distention.  Soft, nontender.  No rebound or guarding.  Benign abdomen.  ED Results / Procedures / Treatments   RADIOLOGY  Pending   MEDICATIONS ORDERED IN ED: Medications - No data to display   IMPRESSION / MDM / ASSESSMENT AND PLAN / ED COURSE  I reviewed the triage vital signs and the nursing notes.  Patient's presentation is most consistent with acute  presentation with potential threat to life or bodily function.  Patient presents emergency department for vaginal spotting and at home positive pregnancy test.  Last menstrual period approximately 1 month ago slightly more per patient.  Overall patient appears well, reassuring physical exam, benign abdominal exam. Patient's lab work has resulted showing a negative urine pregnancy test but a positive quantitative beta-hCG of 128, urinalysis is normal, chemistry shows no concerning findings, CBC shows reassuring findings.  Patient is a A+ blood type, no RhoGAM required.  However given the patient's positive quantitative beta-hCG and last menstrual period occurring approximately 1 month ago we will proceed with an ultrasound to further evaluate.  Differential would include early first trimester pregnancy, miscarriage, threatened miscarriage, less likely ectopic.  Ultrasound pending.  Patient care signed out to oncoming provider.  FINAL CLINICAL IMPRESSION(S) / ED DIAGNOSES   Threatened miscarriage   Note:  This document was prepared using Dragon voice recognition software and may include unintentional dictation errors.   Harvest Dark, MD 01/26/23 2256

## 2023-01-26 NOTE — ED Notes (Signed)
Patient transported to Ultrasound 

## 2023-01-26 NOTE — ED Provider Notes (Signed)
-----------------------------------------   11:29 PM on 01/26/2023 -----------------------------------------  Assuming care from Dr. Kerman Passey.  In short, Amanda Summers is a 27 y.o. female with a chief complaint of vaginal bleeding during early pregnancy.  Refer to the original H&P for additional details.  The current plan of care is to follow-up ultrasound results and reassess.   Clinical Course as of 01/27/23 0250  Fri Jan 27, 2023  0027 I personally viewed and interpreted the patient's ultrasound . I do not see any sign of IUP.  Radiologist agreed, no sign of IUP nor acute abnormality at this time.  Discussed with patient and partner, explained pregnancy is probably just too early to know of sure, but she needs to follow up for repeat HCG (preferably within 48 hours or at least next week), and to establish prenatal care for ultrasound (though ultrasound will likely not be helpful for a few weeks).  I gave her strict return precautions should she develop worsening bleeding, severe pain, or other symptoms that concern her or that may suggest an ectopic pregnancy (she confirmed that she has had 1 in the past). [CF]    Clinical Course User Index [CF] Hinda Kehr, MD     Medications - No data to display   ED Discharge Orders     None      Final diagnoses:  Threatened miscarriage     Hinda Kehr, MD 01/27/23 [phone]

## 2023-01-27 NOTE — Discharge Instructions (Signed)
You have been seen in the Emergency Department (ED) for vaginal bleeding during pregnancy, which is called a "threatened miscarriage" or "threatened abortion".  However, the blood work and ultrasound cannot say for sure that this pregnancy is going to develop into a normal fetus.  It may be too early to know for sure, or it may be that the pregnancy is not going to develop.  You need to follow-up as an outpatient for a repeat blood test called a beta hCG and a repeat ultrasound in order to determine if this is a pregnancy that will never develop, a miscarriage, or a fetus that will develop normally.  As a result of your blood type, you did not receive an injection of medication called Rhogam - please let your OB/Gyn know.  Please follow up as recommended above.  If you develop any other symptoms that concern you (including, but not limited to, persistent vomiting, worsening bleeding, abdominal or pelvic pain, or fever greater than 101), please return immediately to the Emergency Department.  

## 2023-01-30 ENCOUNTER — Other Ambulatory Visit: Payer: Managed Care, Other (non HMO)

## 2023-01-30 ENCOUNTER — Other Ambulatory Visit: Payer: Self-pay | Admitting: Obstetrics and Gynecology

## 2023-01-30 ENCOUNTER — Telehealth: Payer: Self-pay

## 2023-01-30 DIAGNOSIS — O039 Complete or unspecified spontaneous abortion without complication: Secondary | ICD-10-CM

## 2023-01-30 NOTE — Telephone Encounter (Signed)
Pt called stating she went to the ER and they told her to come here for a Beta. Pt added to lab for 1:30 today.

## 2023-01-31 LAB — BETA HCG QUANT (REF LAB): hCG Quant: 388 m[IU]/mL

## 2023-02-02 ENCOUNTER — Other Ambulatory Visit: Payer: Managed Care, Other (non HMO)

## 2023-02-02 ENCOUNTER — Encounter: Payer: Self-pay | Admitting: Obstetrics and Gynecology

## 2023-02-02 ENCOUNTER — Other Ambulatory Visit: Payer: Self-pay | Admitting: Obstetrics and Gynecology

## 2023-02-02 NOTE — Progress Notes (Signed)
Amanda Summers: Not sure what the plan is or who is following this patient but her [name] are rising.  Maybe not as fast as would like.  She needs an ultrasound next week for confirmation of intrauterine pregnancy.

## 2023-02-03 LAB — BETA HCG QUANT (REF LAB): hCG Quant: 563 m[IU]/mL

## 2023-02-06 ENCOUNTER — Other Ambulatory Visit: Payer: Self-pay

## 2023-02-06 DIAGNOSIS — Z8759 Personal history of other complications of pregnancy, childbirth and the puerperium: Secondary | ICD-10-CM

## 2023-02-06 DIAGNOSIS — Z3200 Encounter for pregnancy test, result unknown: Secondary | ICD-10-CM

## 2023-02-09 ENCOUNTER — Ambulatory Visit (INDEPENDENT_AMBULATORY_CARE_PROVIDER_SITE_OTHER): Payer: Managed Care, Other (non HMO)

## 2023-02-09 DIAGNOSIS — Z3A01 Less than 8 weeks gestation of pregnancy: Secondary | ICD-10-CM

## 2023-02-09 DIAGNOSIS — Z3687 Encounter for antenatal screening for uncertain dates: Secondary | ICD-10-CM | POA: Diagnosis not present

## 2023-02-09 DIAGNOSIS — Z8759 Personal history of other complications of pregnancy, childbirth and the puerperium: Secondary | ICD-10-CM

## 2023-02-09 DIAGNOSIS — Z3200 Encounter for pregnancy test, result unknown: Secondary | ICD-10-CM

## 2023-02-24 ENCOUNTER — Other Ambulatory Visit: Payer: Self-pay

## 2023-02-24 DIAGNOSIS — Z8759 Personal history of other complications of pregnancy, childbirth and the puerperium: Secondary | ICD-10-CM

## 2023-02-28 ENCOUNTER — Ambulatory Visit: Payer: Managed Care, Other (non HMO) | Admitting: Obstetrics and Gynecology

## 2023-02-28 ENCOUNTER — Other Ambulatory Visit: Payer: Self-pay | Admitting: Advanced Practice Midwife

## 2023-02-28 ENCOUNTER — Other Ambulatory Visit: Payer: Self-pay | Admitting: Obstetrics and Gynecology

## 2023-02-28 ENCOUNTER — Encounter: Payer: Self-pay | Admitting: Obstetrics and Gynecology

## 2023-02-28 ENCOUNTER — Ambulatory Visit: Payer: Managed Care, Other (non HMO)

## 2023-02-28 DIAGNOSIS — O3680X0 Pregnancy with inconclusive fetal viability, not applicable or unspecified: Secondary | ICD-10-CM

## 2023-02-28 DIAGNOSIS — O039 Complete or unspecified spontaneous abortion without complication: Secondary | ICD-10-CM

## 2023-02-28 DIAGNOSIS — O020 Blighted ovum and nonhydatidiform mole: Secondary | ICD-10-CM

## 2023-02-28 DIAGNOSIS — Z8759 Personal history of other complications of pregnancy, childbirth and the puerperium: Secondary | ICD-10-CM | POA: Diagnosis not present

## 2023-02-28 DIAGNOSIS — Z3A01 Less than 8 weeks gestation of pregnancy: Secondary | ICD-10-CM

## 2023-02-28 MED ORDER — HYDROCODONE-ACETAMINOPHEN 5-325 MG PO TABS: 1.0000 | ORAL_TABLET | Freq: Four times a day (QID) | ORAL | 0 refills | Status: DC | PRN

## 2023-02-28 MED ORDER — MISOPROSTOL 200 MCG PO TABS: ORAL_TABLET | ORAL | 1 refills | Status: DC

## 2023-02-28 NOTE — Progress Notes (Signed)
HPI:      [name] is a 27 y.o. G2P0010 who LMP was Patient's last menstrual period was 12/09/2022 (exact date).  Subjective:   She presents today after having an ultrasound showing an empty sac in the uterus.  This has been present for several days.  No crown-rump length is seen.  Quants have been rising but slowly.  Patient reports no bleeding or cramping at this time.    Hx: The following portions of the patient's history were reviewed and updated as appropriate:             She  has a past medical history of Ectopic pregnancy and No known health problems. She does not have any pertinent problems on file. She  has a past surgical history that includes Foot surgery; Diagnostic laparoscopy with removal of ectopic pregnancy (Right, 05/10/2022); and Laparoscopic unilateral salpingectomy (Right, 05/10/2022). Her family history is not on file. She  reports that she has never smoked. She has never used smokeless tobacco. She reports current alcohol use. She reports that she does not use drugs. She has a current medication list which includes the following prescription(s): hydrocodone-acetaminophen, misoprostol, and ibuprofen. She has No Known Allergies.       Review of Systems:  Review of Systems  Constitutional: Denied constitutional symptoms, night sweats, recent illness, fatigue, fever, insomnia and weight loss.  Eyes: Denied eye symptoms, eye pain, photophobia, vision change and visual disturbance.  Ears/Nose/Throat/Neck: Denied ear, nose, throat or neck symptoms, hearing loss, nasal discharge, sinus congestion and sore throat.  Cardiovascular: Denied cardiovascular symptoms, arrhythmia, chest pain/pressure, edema, exercise intolerance, orthopnea and palpitations.  Respiratory: Denied pulmonary symptoms, asthma, pleuritic pain, productive sputum, cough, dyspnea and wheezing.  Gastrointestinal: Denied, gastro-esophageal reflux, melena, nausea and vomiting.  Genitourinary: Denied  genitourinary symptoms including symptomatic vaginal discharge, pelvic relaxation issues, and urinary complaints.  Musculoskeletal: Denied musculoskeletal symptoms, stiffness, swelling, muscle weakness and myalgia.  Dermatologic: Denied dermatology symptoms, rash and scar.  Neurologic: Denied neurology symptoms, dizziness, headache, neck pain and syncope.  Psychiatric: Denied psychiatric symptoms, anxiety and depression.  Endocrine: Denied endocrine symptoms including hot flashes and night sweats.   Meds:   Current Outpatient Medications on File Prior to Visit  Medication Sig Dispense Refill   ibuprofen (ADVIL) 600 MG tablet Take 1 tablet (600 mg total) by mouth every 6 (six) hours. 30 tablet 0   No current facility-administered medications on file prior to visit.      Objective:     There were no vitals filed for this visit. There were no vitals filed for this visit.            Ultrasound today diagnostic of anembryonic gestation/blighted ovum          Assessment:    G2P0010 Patient Active Problem List   Diagnosis Date Noted   Ruptured right tubal ectopic pregnancy causing hemoperitoneum 05/10/2022     1. Blighted ovum   2. Miscarriage   3. Ultrasound scan to confirm fetal viability with history of miscarriage        Plan:            1.  Options of expectant management, misoprostol, D&C discussed in detail.  Risk benefits of each were reviewed.  Patient desires misoprostol. Prescription given Orders No orders of the defined types were placed in this encounter.    Meds ordered this encounter  Medications   misoprostol (CYTOTEC) 200 MCG tablet    Sig: Place four tablets in between  your gums and cheeks (two tablets on each side) as instructed OR insert four tablets vaginally Repeat in 3 hours if process not begun    Dispense:  4 tablet    Refill:  1   DISCONTD: HYDROcodone-acetaminophen (NORCO/VICODIN) 5-325 MG tablet    Sig: Take 1 tablet by mouth every 6  (six) hours as needed.    Dispense:  10 tablet    Refill:  0   HYDROcodone-acetaminophen (NORCO/VICODIN) 5-325 MG tablet    Sig: Take 1 tablet by mouth every 6 (six) hours as needed.    Dispense:  10 tablet    Refill:  0      F/U  Return in about 2 weeks (around 03/14/2023). I spent 25 minutes involved in the care of this patient preparing to see the patient by obtaining and reviewing her medical history (including labs, imaging tests and prior procedures), documenting clinical information in the electronic health record (EHR), counseling and coordinating care plans, writing and sending prescriptions, ordering tests or procedures and in direct communicating with the patient and medical staff discussing pertinent items from her history and physical exam.  Elonda Husky, M.D. 02/28/2023 11:31 AM

## 2023-02-28 NOTE — Progress Notes (Signed)
Received call from after hours RN that cytotec order was print order. Patient did not receive paper Rx at office. Cytotec reordered electronically.

## 2023-03-01 ENCOUNTER — Telehealth: Payer: Self-pay

## 2023-03-01 ENCOUNTER — Other Ambulatory Visit: Payer: Self-pay

## 2023-03-01 DIAGNOSIS — O020 Blighted ovum and nonhydatidiform mole: Secondary | ICD-10-CM

## 2023-03-01 MED ORDER — MISOPROSTOL 200 MCG PO TABS
ORAL_TABLET | ORAL | 1 refills | Status: DC
Start: 2023-03-01 — End: 2023-03-21

## 2023-03-01 NOTE — Telephone Encounter (Signed)
Joe, Pharmacist at [facility], calling to let us know he does not currently have misoprostol tablets in stock; it will be Friday at the earliest before he will get more in; he has left a msg for the pt as well.  Not sure of the urgency of this. [phone]

## 2023-03-01 NOTE — Telephone Encounter (Signed)
Patient given printed Rx to take to another pharmacy.

## 2023-03-01 NOTE — Telephone Encounter (Signed)
Pharmacy notified.

## 2023-03-21 ENCOUNTER — Encounter: Payer: Self-pay | Admitting: Obstetrics and Gynecology

## 2023-03-21 ENCOUNTER — Ambulatory Visit: Payer: Managed Care, Other (non HMO) | Admitting: Obstetrics and Gynecology

## 2023-03-21 VITALS — BP 101/68 | HR 99 | Ht 67.0 in | Wt 247.5 lb

## 2023-03-21 DIAGNOSIS — O039 Complete or unspecified spontaneous abortion without complication: Secondary | ICD-10-CM

## 2023-03-21 DIAGNOSIS — Z8759 Personal history of other complications of pregnancy, childbirth and the puerperium: Secondary | ICD-10-CM

## 2023-03-21 DIAGNOSIS — Z3009 Encounter for other general counseling and advice on contraception: Secondary | ICD-10-CM | POA: Diagnosis not present

## 2023-03-21 DIAGNOSIS — O020 Blighted ovum and nonhydatidiform mole: Secondary | ICD-10-CM

## 2023-03-21 NOTE — Progress Notes (Signed)
Patient presents today to follow-up after recent miscarriage. She states doing well now, no longer having bleeding or pain. She would like to discuss birth control options today.

## 2023-03-21 NOTE — Progress Notes (Signed)
HPI:      Amanda Summers is a 27 y.o. G2P0010 who LMP was Patient's last menstrual period was 12/09/2022 (exact date).  Subjective:   She presents today after using Cytotec to have a miscarriage for blighted ovum.  She reports she is now doing well.  She is stop bleeding.  She would like to discuss birth control today.    Hx: The following portions of the patient's history were reviewed and updated as appropriate:             She  has a past medical history of Ectopic pregnancy and No known health problems. She does not have any pertinent problems on file. She  has a past surgical history that includes Foot surgery; Diagnostic laparoscopy with removal of ectopic pregnancy (Right, 05/10/2022); and Laparoscopic unilateral salpingectomy (Right, 05/10/2022). Her family history is not on file. She  reports that she has never smoked. She has never used smokeless tobacco. She reports current alcohol use. She reports that she does not use drugs. She currently has no medications in their medication list. She has No Known Allergies.       Review of Systems:  Review of Systems  Constitutional: Denied constitutional symptoms, night sweats, recent illness, fatigue, fever, insomnia and weight loss.  Eyes: Denied eye symptoms, eye pain, photophobia, vision change and visual disturbance.  Ears/Nose/Throat/Neck: Denied ear, nose, throat or neck symptoms, hearing loss, nasal discharge, sinus congestion and sore throat.  Cardiovascular: Denied cardiovascular symptoms, arrhythmia, chest pain/pressure, edema, exercise intolerance, orthopnea and palpitations.  Respiratory: Denied pulmonary symptoms, asthma, pleuritic pain, productive sputum, cough, dyspnea and wheezing.  Gastrointestinal: Denied, gastro-esophageal reflux, melena, nausea and vomiting.  Genitourinary: Denied genitourinary symptoms including symptomatic vaginal discharge, pelvic relaxation issues, and urinary complaints.  Musculoskeletal: Denied  musculoskeletal symptoms, stiffness, swelling, muscle weakness and myalgia.  Dermatologic: Denied dermatology symptoms, rash and scar.  Neurologic: Denied neurology symptoms, dizziness, headache, neck pain and syncope.  Psychiatric: Denied psychiatric symptoms, anxiety and depression.  Endocrine: Denied endocrine symptoms including hot flashes and night sweats.   Meds:   No current outpatient medications on file prior to visit.   No current facility-administered medications on file prior to visit.      Objective:     Vitals:   03/21/23 1351  BP: 101/68  Pulse: 99   Filed Weights   03/21/23 1351  Weight: 247 lb 8 oz (112.3 kg)                        Assessment:    G2P0010 Patient Active Problem List   Diagnosis Date Noted   Ruptured right tubal ectopic pregnancy causing hemoperitoneum 05/10/2022     1. Miscarriage   2. Birth control counseling        Plan:            1.  We have discussed multiple methods of birth control.  Risk benefits of each discussed in detail.  She has decided upon Mirena IUD.  She will contact us with her next menstrual period for insertion.  Orders No orders of the defined types were placed in this encounter.   No orders of the defined types were placed in this encounter.     F/U  No follow-ups on file. I spent 22 minutes involved in the care of this patient preparing to see the patient by obtaining and reviewing her medical history (including labs, imaging tests and prior procedures), documenting clinical information in the  electronic health record (EHR), counseling and coordinating care plans, writing and sending prescriptions, ordering tests or procedures and in direct communicating with the patient and medical staff discussing pertinent items from her history and physical exam.  Elonda Husky, M.D. 03/21/2023 2:14 PM

## 2023-04-10 ENCOUNTER — Ambulatory Visit: Payer: Managed Care, Other (non HMO) | Admitting: Certified Nurse Midwife

## 2023-04-10 ENCOUNTER — Encounter: Payer: Self-pay | Admitting: Certified Nurse Midwife

## 2023-04-10 VITALS — BP 118/80 | HR 97 | Wt 248.0 lb

## 2023-04-10 DIAGNOSIS — Z3202 Encounter for pregnancy test, result negative: Secondary | ICD-10-CM

## 2023-04-10 DIAGNOSIS — Z30430 Encounter for insertion of intrauterine contraceptive device: Secondary | ICD-10-CM | POA: Diagnosis not present

## 2023-04-10 LAB — POCT URINE PREGNANCY: Preg Test, Ur: NEGATIVE

## 2023-04-10 MED ORDER — LEVONORGESTREL 20 MCG/DAY IU IUD
1.0000 | INTRAUTERINE_SYSTEM | Freq: Once | INTRAUTERINE | Status: AC
Start: 2023-04-10 — End: 2023-04-10
  Administered 2023-04-10: 1 via INTRAUTERINE

## 2023-04-10 NOTE — Progress Notes (Signed)
   Chief Complaint  Patient presents with   Contraception     IUD PROCEDURE NOTE:  Amanda Summers is a 27 y.o. G2P0010 here for Mirena  IUD insertion for contraception   BP 118/80   Pulse 97   Wt 248 lb (112.5 kg)   LMP 04/07/2023 (Exact Date)   Breastfeeding No   BMI 38.84 kg/m   IUD Insertion Procedure Note Patient identified, informed consent performed, consent signed.   Discussed risks of irregular bleeding, cramping, infection, malpositioning or misplacement of the IUD outside the uterus which may require further procedure such as laparoscopy, risk of failure <1%. Time out was performed.  Urine pregnancy test negative.  Speculum placed in the vagina.  Cervix visualized.  Cleaned with Betadine x 2.  Grasped anteriorly with a single tooth tenaculum.  Uterus sounded to 8 cm.   Mirena IUD placed per manufacturer's recommendations.  Strings trimmed to 3 cm. Tenaculum was removed, good hemostasis noted.  Patient tolerated procedure well.   ASSESSMENT:  Encounter for IUD insertion - Plan: POCT urine pregnancy, levonorgestrel (MIRENA) 20 MCG/DAY IUD 1 each   Meds ordered this encounter  Medications   levonorgestrel (MIRENA) 20 MCG/DAY IUD 1 each     Plan:  Patient was given post-procedure instructions.  She was advised to have backup contraception for one week.   Call if you are having increasing pain, cramps or bleeding or if you have a fever greater than 100.4 degrees F., shaking chills, nausea or vomiting. Patient was also asked to check IUD strings periodically and follow up in 4 weeks for IUD check.  No follow-ups on file.  Dominica Severin, CNM 04/10/2023 3:42 PM

## 2023-05-05 ENCOUNTER — Ambulatory Visit: Payer: Managed Care, Other (non HMO) | Admitting: Certified Nurse Midwife

## 2023-06-01 ENCOUNTER — Ambulatory Visit: Payer: Managed Care, Other (non HMO) | Admitting: Certified Nurse Midwife

## 2023-06-01 DIAGNOSIS — Z30432 Encounter for removal of intrauterine contraceptive device: Secondary | ICD-10-CM

## 2023-06-21 ENCOUNTER — Ambulatory Visit: Payer: Managed Care, Other (non HMO) | Admitting: Obstetrics and Gynecology

## 2023-06-21 DIAGNOSIS — Z30432 Encounter for removal of intrauterine contraceptive device: Secondary | ICD-10-CM

## 2023-07-17 ENCOUNTER — Encounter: Payer: Self-pay | Admitting: Obstetrics & Gynecology

## 2023-07-17 ENCOUNTER — Ambulatory Visit (INDEPENDENT_AMBULATORY_CARE_PROVIDER_SITE_OTHER): Payer: Managed Care, Other (non HMO) | Admitting: Obstetrics & Gynecology

## 2023-07-17 VITALS — BP 97/63 | HR 98 | Ht 67.0 in | Wt 249.0 lb

## 2023-07-17 DIAGNOSIS — Z113 Encounter for screening for infections with a predominantly sexual mode of transmission: Secondary | ICD-10-CM

## 2023-07-17 DIAGNOSIS — Z30432 Encounter for removal of intrauterine contraceptive device: Secondary | ICD-10-CM | POA: Diagnosis not present

## 2023-07-17 DIAGNOSIS — Z124 Encounter for screening for malignant neoplasm of cervix: Secondary | ICD-10-CM

## 2023-07-17 DIAGNOSIS — Z30017 Encounter for initial prescription of implantable subdermal contraceptive: Secondary | ICD-10-CM | POA: Diagnosis not present

## 2023-07-17 MED ORDER — ETONOGESTREL 68 MG ~~LOC~~ IMPL
68.0000 mg | DRUG_IMPLANT | Freq: Once | SUBCUTANEOUS | Status: AC
Start: 2023-07-17 — End: 2023-07-17
  Administered 2023-07-17: 68 mg via SUBCUTANEOUS

## 2023-07-17 NOTE — Progress Notes (Signed)
    GYNECOLOGY PROGRESS NOTE  Subjective:    Patient ID: Amonda Plott, female    DOB: [birth year], 27 y.o.   MRN: [id number]  HPI  Patient is a 27 y.o. divorced G69P0010 female who presents for Mirena removal and placement of Nexplanon. She got the Mirena placed a few months ago and had lots of cramping, long periods and postcoital bleeding. She used Nexplanon in the past for a total of 6 years. She was happy with that method and wants to restart Nexplanon.  The following portions of the patient's history were reviewed and updated as appropriate: allergies, current medications, past family history, past medical history, past social history, past surgical history, and problem list.  Review of Systems Pertinent items are noted in HPI.  She had her last pap in [place], [place], denies h/o abnormal paps  Objective:   Blood pressure 97/63, pulse 98, height 5\' 7"  (1.702 m), weight 249 lb (112.9 kg), last menstrual period 06/24/2023. Body mass index is 39 kg/m. Well nourished, well hydrated Black female, no apparent distress She is ambulating and conversing normally.  Consent was signed. Time out procedure was done.  EG- normal, pierced clitoris Vagina- normal mucosa and discharge Mirena strings visualized and intact IUD easily removed.    Her left arm was prepped with betadine and infiltrated with 3 cc of 1% lidocaine at the site of her previous Nexplanons. After adequate anesthesia was assured, the Nexplanon device was placed according to standard of care. Her arm was hemostatic and was bandaged. She tolerated the procedure well.    Assessment:   Contraception Screening for cervical cancer Screening for STI  Plan:   She left prior to getting her blood drawn but will come back for that.

## 2023-07-18 ENCOUNTER — Other Ambulatory Visit: Payer: Managed Care, Other (non HMO)

## 2023-07-19 LAB — HIV ANTIBODY (ROUTINE TESTING W REFLEX): HIV Screen 4th Generation wRfx: NONREACTIVE

## 2023-07-19 LAB — HEPATITIS C ANTIBODY: Hep C Virus Ab: NONREACTIVE

## 2023-07-19 LAB — HEPATITIS B SURFACE ANTIGEN: Hepatitis B Surface Ag: NEGATIVE

## 2023-07-19 LAB — RPR: RPR Ser Ql: NONREACTIVE

## 2023-07-20 LAB — PAP IG AND CT-NG NAA
Chlamydia, Nuc. Acid Amp: NEGATIVE
Gonococcus by Nucleic Acid Amp: NEGATIVE

## 2023-10-18 ENCOUNTER — Other Ambulatory Visit: Payer: Self-pay

## 2023-10-18 ENCOUNTER — Emergency Department: Payer: Managed Care, Other (non HMO)

## 2023-10-18 ENCOUNTER — Emergency Department
Admission: EM | Admit: 2023-10-18 | Discharge: 2023-10-19 | Disposition: A | Discharge status: Home / Self Care | Payer: Managed Care, Other (non HMO) | Attending: Emergency Medicine | Admitting: Emergency Medicine

## 2023-10-18 DIAGNOSIS — R079 Chest pain, unspecified: Secondary | ICD-10-CM | POA: Diagnosis present

## 2023-10-18 DIAGNOSIS — R0602 Shortness of breath: Secondary | ICD-10-CM | POA: Diagnosis not present

## 2023-10-18 DIAGNOSIS — F17200 Nicotine dependence, unspecified, uncomplicated: Secondary | ICD-10-CM | POA: Insufficient documentation

## 2023-10-18 DIAGNOSIS — R0789 Other chest pain: Secondary | ICD-10-CM | POA: Diagnosis not present

## 2023-10-18 LAB — TROPONIN I (HIGH SENSITIVITY): Troponin I (High Sensitivity): <2 ng/L (ref <18)

## 2023-10-18 LAB — BASIC METABOLIC PANEL
Anion gap: 6 (ref 5–15)
BUN: 12 mg/dL (ref 6–20)
CO2: 26 mmol/L (ref 22–32)
Calcium: 8.7 mg/dL — ABNORMAL LOW (ref 8.9–10.3)
Chloride: 102 mmol/L (ref 98–111)
Creatinine, Ser: 0.8 mg/dL (ref 0.44–1.00)
GFR, Estimated: >60 mL/min (ref >60)
Glucose, Bld: 100 mg/dL — ABNORMAL HIGH (ref 70–99)
Potassium: 3.7 mmol/L (ref 3.5–5.1)
Sodium: 134 mmol/L — ABNORMAL LOW (ref 135–145)

## 2023-10-18 LAB — CBC
HCT: 36.1 % (ref 36.0–46.0)
Hemoglobin: 11.2 g/dL — ABNORMAL LOW (ref 12.0–15.0)
MCH: 24.1 pg — ABNORMAL LOW (ref 26.0–34.0)
MCHC: 31 g/dL (ref 30.0–36.0)
MCV: 77.6 fL — ABNORMAL LOW (ref 80.0–100.0)
Platelets: 275 10*3/uL (ref 150–400)
RBC: 4.65 MIL/uL (ref 3.87–5.11)
RDW: 15.3 % (ref 11.5–15.5)
WBC: 4.9 10*3/uL (ref 4.0–10.5)
nRBC: 0 % (ref 0.0–0.2)

## 2023-10-18 LAB — POC URINE PREG, ED: Preg Test, Ur: NEGATIVE

## 2023-10-18 NOTE — ED Triage Notes (Signed)
Pt c/o chest pain that is "shocking feeling" with shortness of breath a nd right shoulder pain, pt denies known cardiac hx. Pt denies cough congestion or fever

## 2023-10-19 LAB — TROPONIN I (HIGH SENSITIVITY): Troponin I (High Sensitivity): <2 ng/L (ref <18)

## 2023-10-19 LAB — D-DIMER, QUANTITATIVE: D-Dimer, Quant: <0.27 ug{FEU}/mL (ref 0.00–0.50)

## 2023-10-19 MED ORDER — IBUPROFEN 800 MG PO TABS
800.0000 mg | ORAL_TABLET | Freq: Once | ORAL | Status: AC
  Administered 2023-10-19: 800 mg via ORAL
  Filled 2023-10-19: qty 1

## 2023-10-19 NOTE — ED Notes (Signed)
Pt discharged by provider from ED with education on discharge instructions and follow up provided by EDP.

## 2023-10-19 NOTE — Discharge Instructions (Signed)
Your workup today showed a normal EKG.  You had no signs of damage to your heart, heart attack, blood clots, pneumonia, fluid on your lungs.  This is likely musculoskeletal in nature.   You may alternate Tylenol 1000 mg every 6 hours as needed for pain, fever and Ibuprofen 800 mg every 6-8 hours as needed for pain, fever.  Please take Ibuprofen with food.  Do not take more than 4000 mg of Tylenol (acetaminophen) in a 24 hour period.   Please go to the following website to schedule new (and existing) patient appointments:   [email or web address]   The following is a list of primary care offices in the area who are accepting new patients at this time.  Please reach out to one of them directly and let them know you would like to schedule an appointment to follow up on an Emergency Department visit, and/or to establish a new primary care provider (PCP).  There are likely other primary care clinics in the are who are accepting new patients, but this is an excellent place to start:  [facility] Lead physician: Dr Shirlee Latch [address] [place], [place] [address] [phone]  [facility] Lead Physician: Dr Alba Cory [address], [place], [place] [address] [phone]  [facility]  Lead Physician: Dr Olevia Perches [address] [place], [place] [address] [phone]  [facility] Lead Physician: Dr Sofie Hartigan [address], [place], [place] [address] [phone]  [facility] Primary Care & Sports Medicine at [facility] [place] [facility] Lead Physician: Dr Bari Edward [address] [place], [place], [place] [address] [phone]

## 2023-10-19 NOTE — ED Provider Notes (Signed)
[facility] Provider Note    Event Date/Time   First MD Initiated Contact with Patient 10/19/23 0001     (approximate)   History   Chest Pain   HPI  Amanda Summers is a 27 y.o. female with no significant past medical history who presents to the emergency department with right-sided chest pain.  Has had some shortness of breath as well.  No fevers, cough, lower extremity swelling or discomfort.  She states it is worse with movement of the arm and shoulder but also with deep inspiration.  She is a smoker and is on birth control.  Denies previous history of PE or DVT.   History provided by patient.    Past Medical History:  Diagnosis Date   Ectopic pregnancy    2023   No known health problems     Past Surgical History:  Procedure Laterality Date   DIAGNOSTIC LAPAROSCOPY WITH REMOVAL OF ECTOPIC PREGNANCY Right 05/10/2022   Procedure: DIAGNOSTIC LAPAROSCOPY WITH REMOVAL OF ECTOPIC PREGNANCY;  Surgeon: Conard Novak, MD;  Location: [facility] ORS;  Service: Gynecology;  Laterality: Right;   FOOT SURGERY     LAPAROSCOPIC UNILATERAL SALPINGECTOMY Right 05/10/2022   Procedure: LAPAROSCOPIC UNILATERAL SALPINGECTOMY;  Surgeon: Conard Novak, MD;  Location: [facility] ORS;  Service: Gynecology;  Laterality: Right;    MEDICATIONS:  Prior to Admission medications   Medication Sig Start Date End Date Taking? Authorizing Provider  levonorgestrel (MIRENA) 20 MCG/DAY IUD 1 each by Intrauterine route once.    [provider]    Physical Exam   Triage Vital Signs: ED Triage Vitals  Encounter Vitals Group     BP 10/18/23 2007 125/73     Systolic BP Percentile --      Diastolic BP Percentile --      Pulse Rate 10/18/23 2007 96     Resp 10/18/23 2007 18     Temp 10/18/23 2007 98.3 F (36.8 C)     Temp Source 10/18/23 2007 Oral     SpO2 10/18/23 2007 96 %     Weight 10/18/23 2004 250 lb (113.4 kg)     Height 10/18/23 2004 5\' 7"  (1.702 m)     Head  Circumference --      Peak Flow --      Pain Score 10/18/23 2004 7     Pain Loc --      Pain Education --      Exclude from Growth Chart --     Most recent vital signs: Vitals:   10/18/23 2007 10/19/23 0030  BP: 125/73 116/68  Pulse: 96 79  Resp: 18 18  Temp: 98.3 F (36.8 C) 98.2 F (36.8 C)  SpO2: 96% 100%    CONSTITUTIONAL: Alert, responds appropriately to questions. Well-appearing; well-nourished HEAD: Normocephalic, atraumatic EYES: Conjunctivae clear, pupils appear equal, sclera nonicteric ENT: normal nose; moist mucous membranes NECK: Supple, normal ROM CARD: RRR; S1 and S2 appreciated, tender to palpation over the right chest wall without crepitus or deformity RESP: Normal chest excursion without splinting or tachypnea; breath sounds clear and equal bilaterally; no wheezes, no rhonchi, no rales, no hypoxia or respiratory distress, speaking full sentences ABD/GI: Non-distended; soft, non-tender, no rebound, no guarding, no peritoneal signs BACK: The back appears normal EXT: Normal ROM in all joints; no deformity noted, no edema, no calf tenderness or calf swelling SKIN: Normal color for age and race; warm; no rash on exposed skin NEURO: Moves all extremities equally, normal speech PSYCH:  The patient's mood and manner are appropriate.   ED Results / Procedures / Treatments   LABS: (all labs ordered are listed, but only abnormal results are displayed) Labs Reviewed  BASIC METABOLIC PANEL - Abnormal; Notable for the following components:      Result Value   Sodium 134 (*)    Glucose, Bld 100 (*)    Calcium 8.7 (*)    All other components within normal limits  CBC - Abnormal; Notable for the following components:   Hemoglobin 11.2 (*)    MCV 77.6 (*)    MCH 24.1 (*)    All other components within normal limits  D-DIMER, QUANTITATIVE  POC URINE PREG, ED  TROPONIN I (HIGH SENSITIVITY)  TROPONIN I (HIGH SENSITIVITY)     EKG:  EKG  Interpretation Date/Time:  Wednesday October 18 2023 20:07:16 EST Ventricular Rate:  107 PR Interval:  146 QRS Duration:  70 QT Interval:  334 QTC Calculation: 445 R Axis:   57  Text Interpretation: Sinus tachycardia Otherwise normal ECG When compared with ECG of 31-Dec-2022 15:05, No significant change was found Confirmed by Rochele Raring [phone]) on 10/19/2023 12:11:45 AM         RADIOLOGY: My personal review and interpretation of imaging: Chest x-ray clear.  I have personally reviewed all radiology reports.   DG Chest 2 View  Result Date: 10/18/2023 CLINICAL DATA:  Chest pain EXAM: CHEST - 2 VIEW COMPARISON:  12/31/2022 FINDINGS: The heart size and mediastinal contours are within normal limits. Both lungs are clear. The visualized skeletal structures are unremarkable. IMPRESSION: No active cardiopulmonary disease. Electronically Signed   By: Alcide Clever M.D.   On: 10/18/2023 20:58     PROCEDURES:  Critical Care performed: No      Procedures    IMPRESSION / MDM / ASSESSMENT AND PLAN / ED COURSE  I reviewed the triage vital signs and the nursing notes.    Patient here with right-sided chest pain, shortness of breath.  She is a smoker on birth control.  The patient is on the cardiac monitor to evaluate for evidence of arrhythmia and/or significant heart rate changes.   DIFFERENTIAL DIAGNOSIS (includes but not limited to):   Chest wall pain, PE, pleurisy, doubt ACS, dissection, pneumonia, pneumothorax   Patient's presentation is most consistent with acute presentation with potential threat to life or bodily function.   PLAN: Patient's labs from triage are unremarkable.  Normal hemoglobin, electrolytes.  Troponin negative.  Chest x-ray reviewed and interpreted by myself and the radiologist and is clear.  Pregnancy test negative.  Will add on D-dimer and give ibuprofen.   MEDICATIONS GIVEN IN ED: Medications  ibuprofen (ADVIL) tablet 800 mg (800 mg Oral Given  10/19/23 0022)     ED COURSE: D-dimer negative.  I feel patient is safe for discharge.  Recommended Tylenol, Motrin over-the-counter for chest wall pain.   At this time, I do not feel there is any life-threatening condition present. I reviewed all nursing notes, vitals, pertinent previous records.  All lab and urine results, EKGs, imaging ordered have been independently reviewed and interpreted by myself.  I reviewed all available radiology reports from any imaging ordered this visit.  Based on my assessment, I feel the patient is safe to be discharged home without further emergent workup and can continue workup as an outpatient as needed. Discussed all findings, treatment plan as well as usual and customary return precautions.  They verbalize understanding and are comfortable with this plan.  Outpatient follow-up has been provided as needed.  All questions have been answered.    CONSULTS:  none   OUTSIDE RECORDS REVIEWED: Reviewed previous OB/GYN notes and 2023 for ruptured ectopic pregnancy.       FINAL CLINICAL IMPRESSION(S) / ED DIAGNOSES   Final diagnoses:  Chest wall pain     Rx / DC Orders   ED Discharge Orders     None        Note:  This document was prepared using Dragon voice recognition software and may include unintentional dictation errors.   Aleayah Chico, Layla Maw, DO 10/19/23 [phone]

## 2023-10-30 ENCOUNTER — Other Ambulatory Visit: Payer: Self-pay

## 2023-10-30 ENCOUNTER — Encounter: Payer: Self-pay | Admitting: Internal Medicine

## 2023-10-30 ENCOUNTER — Ambulatory Visit: Payer: Managed Care, Other (non HMO) | Admitting: Internal Medicine

## 2023-10-30 VITALS — BP 124/76 | HR 100 | Temp 98.1°F | Resp 18 | Ht 67.0 in | Wt 253.9 lb

## 2023-10-30 DIAGNOSIS — F339 Major depressive disorder, recurrent, unspecified: Secondary | ICD-10-CM | POA: Diagnosis not present

## 2023-10-30 DIAGNOSIS — E66812 Obesity, class 2: Secondary | ICD-10-CM

## 2023-10-30 DIAGNOSIS — Z23 Encounter for immunization: Secondary | ICD-10-CM

## 2023-10-30 DIAGNOSIS — Z13220 Encounter for screening for lipoid disorders: Secondary | ICD-10-CM

## 2023-10-30 DIAGNOSIS — Z6839 Body mass index (BMI) 39.0-39.9, adult: Secondary | ICD-10-CM

## 2023-10-30 DIAGNOSIS — F909 Attention-deficit hyperactivity disorder, unspecified type: Secondary | ICD-10-CM

## 2023-10-30 DIAGNOSIS — D649 Anemia, unspecified: Secondary | ICD-10-CM | POA: Diagnosis not present

## 2023-10-30 MED ORDER — BUPROPION HCL ER (XL) 150 MG PO TB24: 150.0000 mg | ORAL_TABLET | Freq: Every day | ORAL | 1 refills | Status: DC

## 2023-10-30 NOTE — Assessment & Plan Note (Signed)
Start Wellbutrin 150 mg XL, recheck in 6 weeks. Discussed potential side effects.

## 2023-10-30 NOTE — Progress Notes (Signed)
New Patient Office Visit  Subjective    Patient ID: Amanda Summers, female    DOB: [birth year]  Age: 27 y.o. MRN: [id number]  CC:  Chief Complaint  Patient presents with   Obesity   ADHD   Establish Care    HPI Amanda Summers presents to establish care. 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   Contraception:  -Currently has Nexplanon, inserted 9/24, doing ok with it so far -No periods on the Nexplanon -Hx of ectopic pregnancy  -Had Mirena but had it removed   Obesity: -Has been overweight most of her adult life -Had tried to lose weight before by cutting carbs and exercising but has a sweet tooth   ADHD/Depression/Anxiety: -Had been on stimulant before but did not like it because it caused insomnia -Had been on Lexapro and Zoloft before but not on anything currently -Not interested in anything that will cause weight gain     10/30/2023    9:43 AM  Depression screen PHQ 2/9  Decreased Interest 1  Down, Depressed, Hopeless 1  PHQ - 2 Score 2  Altered sleeping 1  Tired, decreased energy 1  Change in appetite 2  Feeling bad or failure about yourself  2  Trouble concentrating 2  Moving slowly or fidgety/restless 1  Suicidal thoughts 0  PHQ-9 Score 11  Difficult doing work/chores Somewhat difficult   Health Maintenance: -Blood work due -Pap 9/24 negative  -Tdap due  Outpatient Encounter Medications as of 10/30/2023  Medication Sig   buPROPion (WELLBUTRIN XL) 150 MG 24 hr tablet Take 1 tablet (150 mg total) by mouth daily.   Etonogestrel (NEXPLANON [place]) Inject into the skin.   [DISCONTINUED] levonorgestrel (MIRENA) 20 MCG/DAY IUD 1 each by Intrauterine route once. (Patient not taking: Reported on 10/30/2023)   No facility-administered encounter medications on file as of 10/30/2023.    Past Medical History:  Diagnosis Date   ADHD    Anxiety    Depression    Ectopic pregnancy    2023   GERD (gastroesophageal reflux disease)    No  known health problems     Past Surgical History:  Procedure Laterality Date   DIAGNOSTIC LAPAROSCOPY WITH REMOVAL OF ECTOPIC PREGNANCY Right 05/10/2022   Procedure: DIAGNOSTIC LAPAROSCOPY WITH REMOVAL OF ECTOPIC PREGNANCY;  Surgeon: Conard Novak, MD;  Location: [facility] ORS;  Service: Gynecology;  Laterality: Right;   FOOT SURGERY     LAPAROSCOPIC UNILATERAL SALPINGECTOMY Right 05/10/2022   Procedure: LAPAROSCOPIC UNILATERAL SALPINGECTOMY;  Surgeon: Conard Novak, MD;  Location: [facility] ORS;  Service: Gynecology;  Laterality: Right;    Family History  Problem Relation Age of Onset   Cancer Neg Hx    Breast cancer Neg Hx    Ovarian cancer Neg Hx     Social History   Socioeconomic History   Marital status: Divorced    Spouse name: Not on file   Number of children: Not on file   Years of education: Not on file   Highest education level: Some college, no degree  Occupational History   Not on file  Tobacco Use   Smoking status: Never    Passive exposure: Never   Smokeless tobacco: Never  Vaping Use   Vaping status: Never Used  Substance and Sexual Activity   Alcohol use: Yes    Comment: occasinal   Drug use: Never   Sexual activity: Yes    Birth control/protection: I.U.D.  Other Topics Concern  Not on file  Social History Narrative   Works as a Community education officer at [employer]: Low Risk  (10/26/2023)   Overall Financial Resource Strain (CARDIA)    Difficulty of Paying Living Expenses: Not hard at all  Food Insecurity: No Food Insecurity (10/26/2023)   Hunger Vital Sign    Worried About Running Out of Food in the Last Year: Never true    Ran Out of Food in the Last Year: Never true  Transportation Needs: No Transportation Needs (10/26/2023)   PRAPARE - Administrator, Civil Service (Medical): No    Lack of Transportation (Non-Medical): No  Physical Activity: Unknown (10/26/2023)   Exercise Vital Sign     Days of Exercise per Week: 0 days    Minutes of Exercise per Session: Not on file  Stress: Stress Concern Present (10/26/2023)   [employer] of Occupational Health - Occupational Stress Questionnaire    Feeling of Stress : Very much  Social Connections: Moderately Isolated (10/26/2023)   Social Connection and Isolation Panel [NHANES]    Frequency of Communication with Friends and Family: More than three times a week    Frequency of Social Gatherings with Friends and Family: Once a week    Attends Religious Services: Never    Database administrator or Organizations: No    Attends Engineer, structural: Not on file    Marital Status: Living with partner  Intimate Partner Violence: Not on file    Review of Systems  All other systems reviewed and are negative.       Objective    BP 124/76   Pulse 100   Temp 98.1 F (36.7 C) (Oral)   Resp 18   Ht 5\' 7"  (1.702 m)   Wt 253 lb 14.4 oz (115.2 kg)   LMP 08/22/2023   SpO2 98%   BMI 39.77 kg/m   Physical Exam Constitutional:      Appearance: Normal appearance. She is obese.  HENT:     Head: Normocephalic and atraumatic.     Mouth/Throat:     Mouth: Mucous membranes are moist.     Pharynx: Oropharynx is clear.  Eyes:     Extraocular Movements: Extraocular movements intact.     Conjunctiva/sclera: Conjunctivae normal.     Pupils: Pupils are equal, round, and reactive to light.  Neck:     Comments: No thyromegaly  Cardiovascular:     Rate and Rhythm: Normal rate and regular rhythm.  Pulmonary:     Effort: Pulmonary effort is normal.     Breath sounds: Normal breath sounds.  Musculoskeletal:     Cervical back: No tenderness.  Lymphadenopathy:     Cervical: No cervical adenopathy.  Skin:    General: Skin is warm and dry.  Neurological:     General: No focal deficit present.     Mental Status: She is alert. Mental status is at baseline.  Psychiatric:        Mood and Affect: Mood normal.        Behavior:  Behavior normal.         Assessment & Plan:  Class 2 obesity without serious comorbidity with body mass index (BMI) of 39.0 to 39.9 in adult, unspecified obesity type Assessment & Plan: Labs ordered, will discuss at follow up lifestyle management more in depth.   Orders: -     Hemoglobin A1c -     Lipid panel -  TSH -     Comprehensive metabolic panel  Anemia, unspecified type Assessment & Plan: Not having periods for the last 3 months with the Nexplanon so may resolve but will recheck and add iron panel.   Orders: -     CBC with Differential/Platelet -     Iron, TIBC and Ferritin Panel  Recurrent major depressive disorder, remission status unspecified (HCC) Assessment & Plan: Start Wellbutrin 150 mg XL, recheck in 6 weeks. Discussed potential side effects.   Orders: -     buPROPion HCl ER (XL); Take 1 tablet (150 mg total) by mouth daily.  Dispense: 30 tablet; Refill: 1  Attention deficit hyperactivity disorder (ADHD), unspecified ADHD type Assessment & Plan: Start Wellbutrin 150 mg XL.  Orders: -     buPROPion HCl ER (XL); Take 1 tablet (150 mg total) by mouth daily.  Dispense: 30 tablet; Refill: 1  Lipid screening  Need for diphtheria-tetanus-pertussis (Tdap) vaccine -     Tdap vaccine greater than or equal to 7yo IM  Screening labs ordered and Tdap administered.   Return in about 6 weeks (around 12/11/2023) for follow up for medication recheck.   Margarita Mail, DO

## 2023-10-30 NOTE — Assessment & Plan Note (Signed)
Start Wellbutrin 150 mg XL

## 2023-10-30 NOTE — Assessment & Plan Note (Addendum)
Labs ordered, will discuss at follow up lifestyle management more in depth.

## 2023-10-30 NOTE — Patient Instructions (Signed)
It was great seeing you today!  Plan discussed at today's visit: -Blood work ordered today, results will be uploaded to MyChart.  -Start Wellbutrin 150 mg XL daily  -For acid reflux, can try over the counter Tums, Mylanta, then Pepcid and then Prilosec. If these do not work let me know.   Follow up in: 6 weeks   Take care and let us know if you have any questions or concerns prior to your next visit.  Dr. Caralee Ates  Gastroesophageal Reflux Disease, Adult Gastroesophageal reflux (GER) happens when acid from the stomach flows up into the tube that connects the mouth and the stomach (esophagus). Normally, food travels down the esophagus and stays in the stomach to be digested. However, when a person has GER, food and stomach acid sometimes move back up into the esophagus. If this becomes a more serious problem, the person may be diagnosed with a disease called gastroesophageal reflux disease (GERD). GERD occurs when the reflux: Happens often. Causes frequent or severe symptoms. Causes problems such as damage to the esophagus. When stomach acid comes in contact with the esophagus, the acid may cause inflammation in the esophagus. Over time, GERD may create small holes (ulcers) in the lining of the esophagus. What are the causes? This condition is caused by a problem with the muscle between the esophagus and the stomach (lower esophageal sphincter, or LES). Normally, the LES muscle closes after food passes through the esophagus to the stomach. When the LES is weakened or abnormal, it does not close properly, and that allows food and stomach acid to go back up into the esophagus. The LES can be weakened by certain dietary substances, medicines, and medical conditions, including: Tobacco use. Pregnancy. Having a hiatal hernia. Alcohol use. Certain foods and beverages, such as coffee, chocolate, onions, and peppermint. What increases the risk? You are more likely to develop this condition if  you: Have an increased body weight. Have a connective tissue disorder. Take NSAIDs, such as ibuprofen. What are the signs or symptoms? Symptoms of this condition include: Heartburn. Difficult or painful swallowing and the feeling of having a lump in the throat. A bitter taste in the mouth. Bad breath and having a large amount of saliva. Having an upset or bloated stomach and belching. Chest pain. Different conditions can cause chest pain. Make sure you see your health care provider if you experience chest pain. Shortness of breath or wheezing. Ongoing (chronic) cough or a nighttime cough. Wearing away of tooth enamel. Weight loss. How is this diagnosed? This condition may be diagnosed based on a medical history and a physical exam. To determine if you have mild or severe GERD, your health care provider may also monitor how you respond to treatment. You may also have tests, including: A test to examine your stomach and esophagus with a small camera (endoscopy). A test that measures the acidity level in your esophagus. A test that measures how much pressure is on your esophagus. A barium swallow or modified barium swallow test to show the shape, size, and functioning of your esophagus. How is this treated? Treatment for this condition may vary depending on how severe your symptoms are. Your health care provider may recommend: Changes to your diet. Medicine. Surgery. The goal of treatment is to help relieve your symptoms and to prevent complications. Follow these instructions at home: Eating and drinking  Follow a diet as recommended by your health care provider. This may involve avoiding foods and drinks such as: Coffee and  tea, with or without caffeine. Drinks that contain alcohol. Energy drinks and sports drinks. Carbonated drinks or sodas. Chocolate and cocoa. Peppermint and mint flavorings. Garlic and onions. Horseradish. Spicy and acidic foods, including peppers, chili  powder, curry powder, vinegar, hot sauces, and barbecue sauce. Citrus fruit juices and citrus fruits, such as oranges, lemons, and limes. Tomato-based foods, such as red sauce, chili, salsa, and pizza with red sauce. Fried and fatty foods, such as donuts, french fries, potato chips, and high-fat dressings. High-fat meats, such as hot dogs and fatty cuts of red and white meats, such as rib eye steak, sausage, ham, and bacon. High-fat dairy items, such as whole milk, butter, and cream cheese. Eat small, frequent meals instead of large meals. Avoid drinking large amounts of liquid with your meals. Avoid eating meals during the 2-3 hours before bedtime. Avoid lying down right after you eat. Do not exercise right after you eat. Lifestyle  Do not use any products that contain nicotine or tobacco. These products include cigarettes, chewing tobacco, and vaping devices, such as e-cigarettes. If you need help quitting, ask your health care provider. Try to reduce your stress by using methods such as yoga or meditation. If you need help reducing stress, ask your health care provider. If you are overweight, reduce your weight to an amount that is healthy for you. Ask your health care provider for guidance about a safe weight loss goal. General instructions Pay attention to any changes in your symptoms. Take over-the-counter and prescription medicines only as told by your health care provider. Do not take aspirin, ibuprofen, or other NSAIDs unless your health care provider told you to take these medicines. Wear loose-fitting clothing. Do not wear anything tight around your waist that causes pressure on your abdomen. Raise (elevate) the head of your bed about 6 inches (15 cm). You can use a wedge to do this. Avoid bending over if this makes your symptoms worse. Keep all follow-up visits. This is important. Contact a health care provider if: You have: New symptoms. Unexplained weight loss. Difficulty  swallowing or it hurts to swallow. Wheezing or a persistent cough. A hoarse voice. Your symptoms do not improve with treatment. Get help right away if: You have sudden pain in your arms, neck, jaw, teeth, or back. You suddenly feel sweaty, dizzy, or light-headed. You have chest pain or shortness of breath. You vomit and the vomit is green, yellow, or black, or it looks like blood or coffee grounds. You faint. You have stool that is red, bloody, or black. You cannot swallow, drink, or eat. These symptoms may represent a serious problem that is an emergency. Do not wait to see if the symptoms will go away. Get medical help right away. Call your local emergency services (911 in the [place] Do not drive yourself to the hospital. Summary Gastroesophageal reflux happens when acid from the stomach flows up into the esophagus. GERD is a disease in which the reflux happens often, causes frequent or severe symptoms, or causes problems such as damage to the esophagus. Treatment for this condition may vary depending on how severe your symptoms are. Your health care provider may recommend diet and lifestyle changes, medicine, or surgery. Contact a health care provider if you have new or worsening symptoms. Take over-the-counter and prescription medicines only as told by your health care provider. Do not take aspirin, ibuprofen, or other NSAIDs unless your health care provider told you to do so. Keep all follow-up visits as told by  your health care provider. This is important. This information is not intended to replace advice given to you by your health care provider. Make sure you discuss any questions you have with your health care provider. Document Revised: 05/02/2020 Document Reviewed: 05/04/2020 Elsevier Patient Education  2024 [employer].

## 2023-10-30 NOTE — Assessment & Plan Note (Signed)
Not having periods for the last 3 months with the Nexplanon so may resolve but will recheck and add iron panel.

## 2023-11-04 LAB — COMPREHENSIVE METABOLIC PANEL
ALT: 17 [IU]/L (ref 0–32)
AST: 21 [IU]/L (ref 0–40)
Albumin: 3.9 g/dL — ABNORMAL LOW (ref 4.0–5.0)
Alkaline Phosphatase: 71 [IU]/L (ref 44–121)
BUN/Creatinine Ratio: 12 (ref 9–23)
BUN: 11 mg/dL (ref 6–20)
Bilirubin Total: 0.3 mg/dL (ref 0.0–1.2)
CO2: 23 mmol/L (ref 20–29)
Calcium: 9.3 mg/dL (ref 8.7–10.2)
Chloride: 101 mmol/L (ref 96–106)
Creatinine, Ser: 0.89 mg/dL (ref 0.57–1.00)
Globulin, Total: 2.7 g/dL (ref 1.5–4.5)
Glucose: 87 mg/dL (ref 70–99)
Potassium: 4.3 mmol/L (ref 3.5–5.2)
Sodium: 136 mmol/L (ref 134–144)
Total Protein: 6.6 g/dL (ref 6.0–8.5)
eGFR: 90 mL/min/{1.73_m2} (ref >59)

## 2023-11-04 LAB — LIPID PANEL
Chol/HDL Ratio: 2.6 {ratio} (ref 0.0–4.4)
Cholesterol, Total: 133 mg/dL (ref 100–199)
HDL: 51 mg/dL (ref >39)
LDL Chol Calc (NIH): 71 mg/dL (ref 0–99)
Triglycerides: 48 mg/dL (ref 0–149)
VLDL Cholesterol Cal: 11 mg/dL (ref 5–40)

## 2023-11-04 LAB — CBC WITH DIFFERENTIAL/PLATELET
Basophils Absolute: 0 10*3/uL (ref 0.0–0.2)
Basos: 1 %
EOS (ABSOLUTE): 0.1 10*3/uL (ref 0.0–0.4)
Eos: 1 %
Hematocrit: 36.9 % (ref 34.0–46.6)
Hemoglobin: 11.5 g/dL (ref 11.1–15.9)
Immature Grans (Abs): 0 10*3/uL (ref 0.0–0.1)
Immature Granulocytes: 0 %
Lymphocytes Absolute: 2.4 10*3/uL (ref 0.7–3.1)
Lymphs: 47 %
MCH: 24.5 pg — ABNORMAL LOW (ref 26.6–33.0)
MCHC: 31.2 g/dL — ABNORMAL LOW (ref 31.5–35.7)
MCV: 79 fL (ref 79–97)
Monocytes Absolute: 0.3 10*3/uL (ref 0.1–0.9)
Monocytes: 6 %
Neutrophils Absolute: 2.3 10*3/uL (ref 1.4–7.0)
Neutrophils: 45 %
Platelets: 269 10*3/uL (ref 150–450)
RBC: 4.69 x10E6/uL (ref 3.77–5.28)
RDW: 14.5 % (ref 11.7–15.4)
WBC: 5 10*3/uL (ref 3.4–10.8)

## 2023-11-04 LAB — IRON,TIBC AND FERRITIN PANEL
Ferritin: 24 ng/mL (ref 15–150)
Iron Saturation: 11 % — ABNORMAL LOW (ref 15–55)
Iron: 34 ug/dL (ref 27–159)
Total Iron Binding Capacity: 321 ug/dL (ref 250–450)
UIBC: 287 ug/dL (ref 131–425)

## 2023-11-04 LAB — HEMOGLOBIN A1C
Est. average glucose Bld gHb Est-mCnc: 108 mg/dL
Hgb A1c MFr Bld: 5.4 % (ref 4.8–5.6)

## 2023-11-04 LAB — TSH: TSH: 2.82 u[IU]/mL (ref 0.450–4.500)

## 2023-12-12 ENCOUNTER — Other Ambulatory Visit: Payer: Self-pay

## 2023-12-12 ENCOUNTER — Encounter: Payer: Self-pay | Admitting: Internal Medicine

## 2023-12-12 ENCOUNTER — Ambulatory Visit: Payer: Managed Care, Other (non HMO) | Admitting: Internal Medicine

## 2023-12-12 VITALS — BP 120/78 | HR 94 | Temp 97.8°F | Resp 18 | Ht 67.0 in | Wt 263.8 lb

## 2023-12-12 DIAGNOSIS — Z6839 Body mass index (BMI) 39.0-39.9, adult: Secondary | ICD-10-CM

## 2023-12-12 DIAGNOSIS — F339 Major depressive disorder, recurrent, unspecified: Secondary | ICD-10-CM

## 2023-12-12 DIAGNOSIS — E66812 Obesity, class 2: Secondary | ICD-10-CM

## 2023-12-12 DIAGNOSIS — F909 Attention-deficit hyperactivity disorder, unspecified type: Secondary | ICD-10-CM | POA: Diagnosis not present

## 2023-12-12 MED ORDER — BUPROPION HCL ER (XL) 150 MG PO TB24: 150.0000 mg | ORAL_TABLET | Freq: Every day | ORAL | 1 refills | Status: DC

## 2023-12-12 NOTE — Progress Notes (Signed)
 Established Patient Office Visit  Subjective   Patient ID: Amanda Summers, female    DOB: 19-Jun-1996  Age: 28 y.o. MRN: [id number]  Chief Complaint  Patient presents with   Medical Management of Chronic Issues    6 week recheck    HPI  Amanda Summers presents for follow up on medication change.   Contraception:  -Currently has Nexplanon , inserted 9/24, doing well  -No periods on the Nexplanon  -Hx of ectopic pregnancy  -Had Mirena  but had it removed   Obesity: -Has been overweight most of her adult life -Had tried to lose weight before by cutting carbs and exercising but has a sweet tooth  -Strength training - lifting weights, 3 x a week for 1 hour -Working on diet by decreasing carbs but not monitoring calorie intake  ADHD/Depression/Anxiety: -Started on Wellbutrin  XL 150 mg at LOV -today she states she is doing well with the medication has noticed some improvements.  She denies any side effects.  She would like to continue this medication at the current dose. -Had been on stimulant before but did not like it because it caused insomnia -Had been on Lexapro and Zoloft before but not on anything currently     10/30/2023    9:43 AM  Depression screen PHQ 2/9  Decreased Interest 1  Down, Depressed, Hopeless 1  PHQ - 2 Score 2  Altered sleeping 1  Tired, decreased energy 1  Change in appetite 2  Feeling bad or failure about yourself  2  Trouble concentrating 2  Moving slowly or fidgety/restless 1  Suicidal thoughts 0  PHQ-9 Score 11  Difficult doing work/chores Somewhat difficult    Health Maintenance: -Blood work UTD -Pap 9/24 negative   Patient Active Problem List   Diagnosis Date Noted   Anemia 10/30/2023   Recurrent major depressive disorder (HCC) 10/30/2023   Attention deficit hyperactivity disorder (ADHD) 10/30/2023   Ruptured right tubal ectopic pregnancy causing hemoperitoneum 05/10/2022   Obesity, morbid (HCC) 04/08/2021   Past Medical History:   Diagnosis Date   ADHD    Anxiety    Depression    Ectopic pregnancy    2023   GERD (gastroesophageal reflux disease)    No known health problems    Past Surgical History:  Procedure Laterality Date   DIAGNOSTIC LAPAROSCOPY WITH REMOVAL OF ECTOPIC PREGNANCY Right 05/10/2022   Procedure: DIAGNOSTIC LAPAROSCOPY WITH REMOVAL OF ECTOPIC PREGNANCY;  Surgeon: Leonce Garnette BIRCH, MD;  Location: [facility];  Service: Gynecology;  Laterality: Right;   FOOT SURGERY     LAPAROSCOPIC UNILATERAL SALPINGECTOMY Right 05/10/2022   Procedure: LAPAROSCOPIC UNILATERAL SALPINGECTOMY;  Surgeon: Leonce Garnette BIRCH, MD;  Location: [facility];  Service: Gynecology;  Laterality: Right;   Social History   Tobacco Use   Smoking status: Never    Passive exposure: Never   Smokeless tobacco: Never  Vaping Use   Vaping status: Never Used  Substance Use Topics   Alcohol use: Yes    Comment: occasinal   Drug use: Never   Social History   Socioeconomic History   Marital status: Divorced    Spouse name: Not on file   Number of children: Not on file   Years of education: Not on file   Highest education level: Some college, no degree  Occupational History   Not on file  Tobacco Use   Smoking status: Never    Passive exposure: Never   Smokeless tobacco: Never  Vaping Use   Vaping status: Never Used  Substance and Sexual Activity   Alcohol use: Yes    Comment: occasinal   Drug use: Never   Sexual activity: Yes    Birth control/protection: I.U.D.  Other Topics Concern   Not on file  Social History Narrative   Works as a community education officer at [employer]   Social Drivers of [employer]: Low Risk  (10/26/2023)   Overall Financial Resource Strain (CARDIA)    Difficulty of Paying Living Expenses: Not hard at all  Food Insecurity: No Food Insecurity (10/26/2023)   Hunger Vital Sign    Worried About Running Out of Food in the Last Year: Never true    Ran Out of Food in the Last Year: Never  true  Transportation Needs: No Transportation Needs (10/26/2023)   PRAPARE - Administrator, Civil Service (Medical): No    Lack of Transportation (Non-Medical): No  Physical Activity: Unknown (10/26/2023)   Exercise Vital Sign    Days of Exercise per Week: 0 days    Minutes of Exercise per Session: Not on file  Stress: Stress Concern Present (10/26/2023)   [employer] of Occupational Health - Occupational Stress Questionnaire    Feeling of Stress : Very much  Social Connections: Moderately Isolated (10/26/2023)   Social Connection and Isolation Panel [NHANES]    Frequency of Communication with Friends and Family: More than three times a week    Frequency of Social Gatherings with Friends and Family: Once a week    Attends Religious Services: Never    Database Administrator or Organizations: No    Attends Engineer, Structural: Not on file    Marital Status: Living with partner  Intimate Partner Violence: Not on file   Family Status  Relation Name Status   Neg Hx  (Not Specified)  No partnership data on file   Family History  Problem Relation Age of Onset   Cancer Neg Hx    Breast cancer Neg Hx    Ovarian cancer Neg Hx    No Known Allergies    Review of Systems  All other systems reviewed and are negative.     Objective:     BP 120/78 (Cuff Size: Large)   Pulse 94   Temp 97.8 F (36.6 C) (Oral)   Resp 18   Ht 5' 7 (1.702 m)   Wt 263 lb 12.8 oz (119.7 kg)   SpO2 98%   BMI 41.32 kg/m  BP Readings from Last 3 Encounters:  12/12/23 120/78  10/30/23 124/76  10/19/23 116/68   Wt Readings from Last 3 Encounters:  12/12/23 263 lb 12.8 oz (119.7 kg)  10/30/23 253 lb 14.4 oz (115.2 kg)  10/18/23 250 lb (113.4 kg)      Physical Exam Constitutional:      Appearance: Normal appearance.  HENT:     Head: Normocephalic and atraumatic.  Eyes:     Conjunctiva/sclera: Conjunctivae normal.  Cardiovascular:     Rate and Rhythm: Normal  rate and regular rhythm.  Pulmonary:     Effort: Pulmonary effort is normal.     Breath sounds: Normal breath sounds.  Skin:    General: Skin is warm and dry.  Neurological:     General: No focal deficit present.     Mental Status: She is alert. Mental status is at baseline.  Psychiatric:        Mood and Affect: Mood normal.        Behavior: Behavior normal.  No results found for any visits on 12/12/23.  Last CBC Lab Results  Component Value Date   WBC 5.0 11/03/2023   HGB 11.5 11/03/2023   HCT 36.9 11/03/2023   MCV 79 11/03/2023   MCH 24.5 (L) 11/03/2023   RDW 14.5 11/03/2023   PLT 269 11/03/2023   Last metabolic panel Lab Results  Component Value Date   GLUCOSE 87 11/03/2023   NA 136 11/03/2023   K 4.3 11/03/2023   CL 101 11/03/2023   CO2 23 11/03/2023   BUN 11 11/03/2023   CREATININE 0.89 11/03/2023   EGFR 90 11/03/2023   CALCIUM 9.3 11/03/2023   PROT 6.6 11/03/2023   ALBUMIN 3.9 (L) 11/03/2023   LABGLOB 2.7 11/03/2023   BILITOT 0.3 11/03/2023   ALKPHOS 71 11/03/2023   AST 21 11/03/2023   ALT 17 11/03/2023   ANIONGAP 6 10/18/2023   Last lipids Lab Results  Component Value Date   CHOL 133 11/03/2023   HDL 51 11/03/2023   LDLCALC 71 11/03/2023   TRIG 48 11/03/2023   CHOLHDL 2.6 11/03/2023   Last hemoglobin A1c Lab Results  Component Value Date   HGBA1C 5.4 11/03/2023   Last thyroid functions Lab Results  Component Value Date   TSH 2.820 11/03/2023   Last vitamin D No results found for: 25OHVITD2, 25OHVITD3, VD25OH Last vitamin B12 and Folate No results found for: VITAMINB12, FOLATE    The ASCVD Risk score (Arnett DK, et al., 2019) failed to calculate for the following reasons:   The 2019 ASCVD risk score is only valid for ages 48 to 53    Assessment & Plan:   1. Recurrent major depressive disorder, remission status unspecified (HCC) (Primary)/Attention deficit hyperactivity disorder (ADHD), unspecified ADHD type: Doing  well on Wellbutrin  XL 150 mg daily, patient is comfortable with current dose which will be refilled today.  Will schedule virtual follow-up in about 3 months to reassess.  - buPROPion  (WELLBUTRIN  XL) 150 MG 24 hr tablet; Take 1 tablet (150 mg total) by mouth daily.  Dispense: 90 tablet; Refill: 1  2. Class 2 obesity without serious comorbidity with body mass index (BMI) of 39.0 to 39.9 in adult, unspecified obesity type: Reviewed labs, no metabolic reason why patient should not be losing weight.  Discussed GLP-1 medications, how they work and common side effects.  For now patient would prefer not to start medication and continue working on lifestyle changes on her own.  Will continue to monitor.   Return in about 3 months (around 03/10/2024) for virtual to discuss meds.    Sharyle Fischer, DO

## 2023-12-31 NOTE — Progress Notes (Unsigned)
    Celso Amy, PA-C [address]  [address]  [place], [place] [address]  Main: [phone]  Fax: [phone]   Gastroenterology Consultation  Referring Provider:     Margarita Mail, DO Primary Care Physician:  Margarita Mail, DO Primary Gastroenterologist:  *** Reason for Consultation:     Constipation, Bloating        HPI:   Amanda Summers is a 28 y.o. y/o female referred for consultation & management  by Margarita Mail, DO.    Here to evaluate constipation.  Also has history of GERD.  No previous GI evaluation.  Past Medical History:  Diagnosis Date   ADHD    Anxiety    Depression    Ectopic pregnancy    2023   GERD (gastroesophageal reflux disease)    No known health problems     Past Surgical History:  Procedure Laterality Date   DIAGNOSTIC LAPAROSCOPY WITH REMOVAL OF ECTOPIC PREGNANCY Right 05/10/2022   Procedure: DIAGNOSTIC LAPAROSCOPY WITH REMOVAL OF ECTOPIC PREGNANCY;  Surgeon: Conard Novak, MD;  Location: [facility];  Service: Gynecology;  Laterality: Right;   FOOT SURGERY     LAPAROSCOPIC UNILATERAL SALPINGECTOMY Right 05/10/2022   Procedure: LAPAROSCOPIC UNILATERAL SALPINGECTOMY;  Surgeon: Conard Novak, MD;  Location: [facility] OR[facility];  Service: Gynecology;  Laterality: Right;    Prior to Admission medications   Medication Sig Start Date End Date Taking? Authorizing Provider  buPROPion (WELLBUTRIN XL) 150 MG 24 hr tablet Take 1 tablet (150 mg total) by mouth daily. 12/12/23   Margarita Mail, DO  [facility]tonogestrel [facility]) Inject into the skin.    [provider]    Family History  Problem Relation Age of Onset   Cancer Neg Hx    Breast cancer Neg Hx    Ovarian cancer Neg Hx      Social History   Tobacco Use   Smoking status: Never    Passive exposure: Never   Smokeless tobacco: Never  Vaping Use   Vaping status: Never Used  Substance Use Topics   Alcohol use: Yes    Comment: occasinal   Drug use: Never     Allergies as of 01/02/2024   (No Known Allergies)    Review of Systems:    All systems reviewed and negative except where noted in HPI.   Physical Exam:  There were no vitals taken for this visit. No LMP recorded.  General:   Alert,  Well-developed, well-nourished, pleasant and cooperative in NAD Lungs:  Respirations even and unlabored.  Clear throughout to auscultation.   No wheezes, crackles, or rhonchi. No acute distress. Heart:  Regular rate and rhythm; no murmurs, clicks, rubs, or gallops. Abdomen:  Normal bowel sounds.  No bruits.  Soft, and non-distended without masses, hepatosplenomegaly or hernias noted.  No Tenderness.  No guarding or rebound tenderness.    Neurologic:  Alert and oriented x3;  grossly normal neurologically. Psych:  Alert and cooperative. Normal mood and affect.  Imaging Studies: No results found.  Assessment and Plan:   Amanda Summers is a 28 y.o. y/o female has been referred for ***  Follow up ***  Celso Amy, PA-C    BP check ***

## 2024-01-02 ENCOUNTER — Encounter: Payer: Self-pay | Admitting: Physician Assistant

## 2024-01-02 ENCOUNTER — Ambulatory Visit (INDEPENDENT_AMBULATORY_CARE_PROVIDER_SITE_OTHER): Payer: Managed Care, Other (non HMO) | Admitting: Physician Assistant

## 2024-01-02 VITALS — BP 119/76 | HR 98 | Temp 98.6°F | Ht 67.0 in | Wt 265.0 lb

## 2024-01-02 DIAGNOSIS — K5900 Constipation, unspecified: Secondary | ICD-10-CM | POA: Diagnosis not present

## 2024-01-02 DIAGNOSIS — D509 Iron deficiency anemia, unspecified: Secondary | ICD-10-CM | POA: Diagnosis not present

## 2024-01-02 DIAGNOSIS — K219 Gastro-esophageal reflux disease without esophagitis: Secondary | ICD-10-CM

## 2024-01-02 NOTE — Patient Instructions (Signed)
  For Acid Reflux, Start OTC Prilosec (Omeprazole) 20mg  1 tablet once daily.      For constipation: Start OTC Miralax Powder Mix 1 capful in 6 to 8 ounces of a drink once daily  Recommend high-fiber diet, 30 g of fiber daily Eat fruits, vegetables, and whole grains Drink 64 ounces of water / fluids daily.

## 2024-01-07 ENCOUNTER — Encounter: Payer: Self-pay | Admitting: Internal Medicine

## 2024-01-09 NOTE — Addendum Note (Signed)
 Addended by: Roena Malady on: 01/09/2024 09:53 AM   Modules accepted: Orders

## 2024-01-24 ENCOUNTER — Telehealth: Payer: Self-pay

## 2024-01-24 NOTE — Telephone Encounter (Signed)
 Pt call transferred from Endo to reschedule her tomorrows colonoscopy with Dr. Tobi Bastos.  Colonoscopy has been rescheduled from 03/20 with Dr. Tobi Bastos to 05/08 with Dr. Allegra Lai.  She is a patient of [facility].  Message routed to [place] and her CMA Windella to make them aware of procedure reschedule.  Trish in Endo notified.  Referral updated.  Thanks, [place], [place]

## 2024-02-13 ENCOUNTER — Telehealth: Payer: Self-pay

## 2024-02-13 NOTE — Telephone Encounter (Signed)
 Left vm for pt to return my call regarding cancelling appt with Dr. Tobi Bastos on 02/22/24. Pt scheduled for a colonoscopy on 03/14/24. Will need to wait for results of that procedure to move forward with a treatment plan. MyChart message sent to pt as well informing of this information.

## 2024-02-21 IMAGING — US US OB < 14 WEEKS - US OB TV
1 series · 13 of 28 positions shown · non-contrast
Comparison: None Available.

CLINICAL DATA: 89637. Spotting. Quantitative beta HCG [DATE]. Last
menstrual. 03/20/2022.

EXAM:
OBSTETRIC <14 WK US AND TRANSVAGINAL OB US
TECHNIQUE: Both transabdominal and transvaginal ultrasound examinations were
performed for complete evaluation of the gestation as well as the
maternal uterus, adnexal regions, and pelvic cul-de-sac.
Transvaginal technique was performed to assess early pregnancy.

[Series 1: us ob comp less 14 wks · 69 acquisitions, 13 frames shown]
[im 3/69]
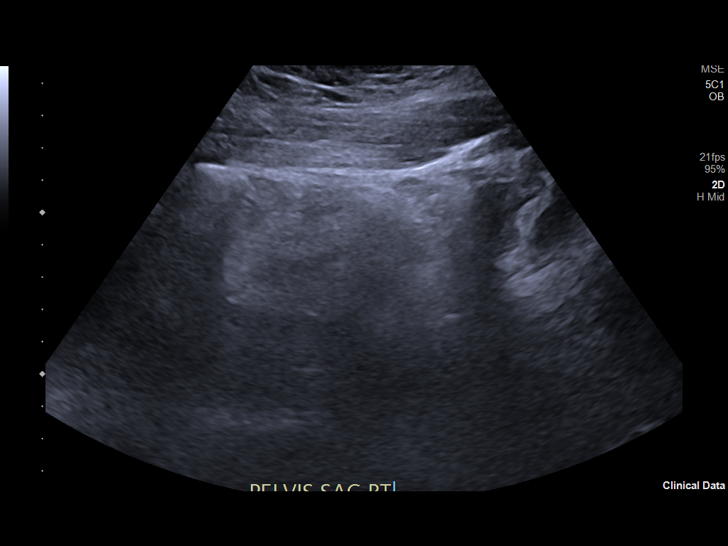
[im 8/69]
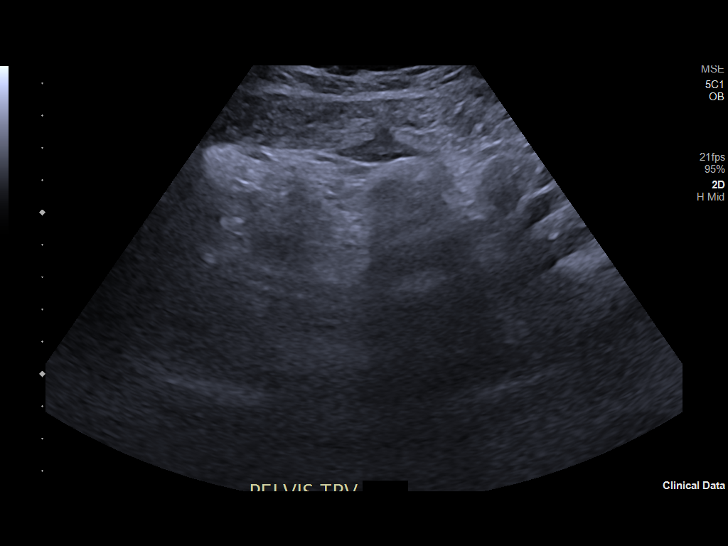
[im 13/69]
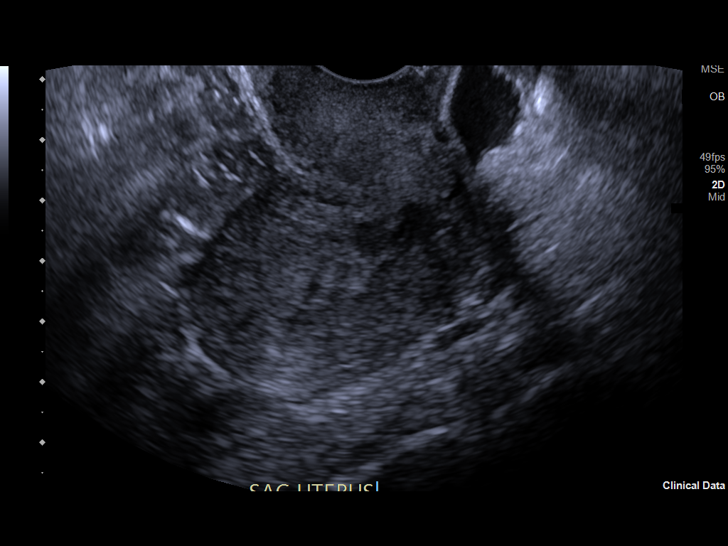
[im 18/69]
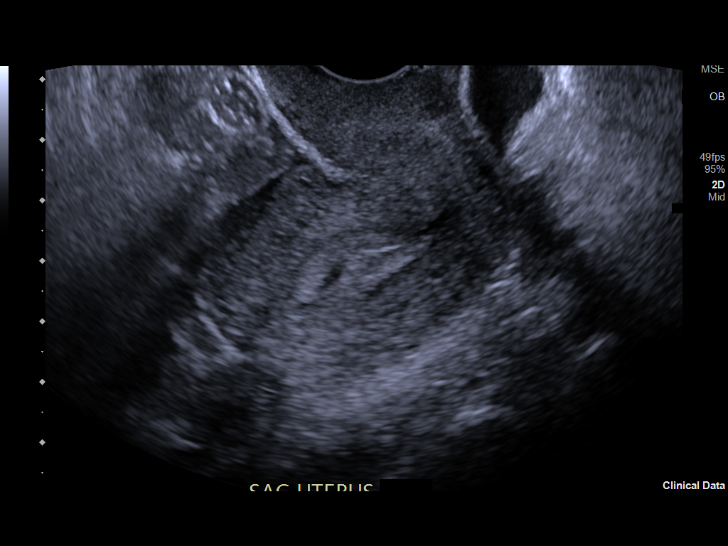
[im 23/69]
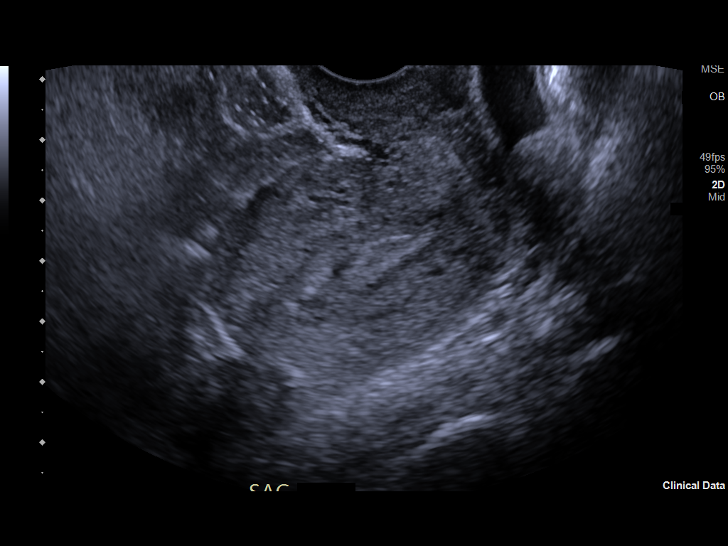
[im 28/69]
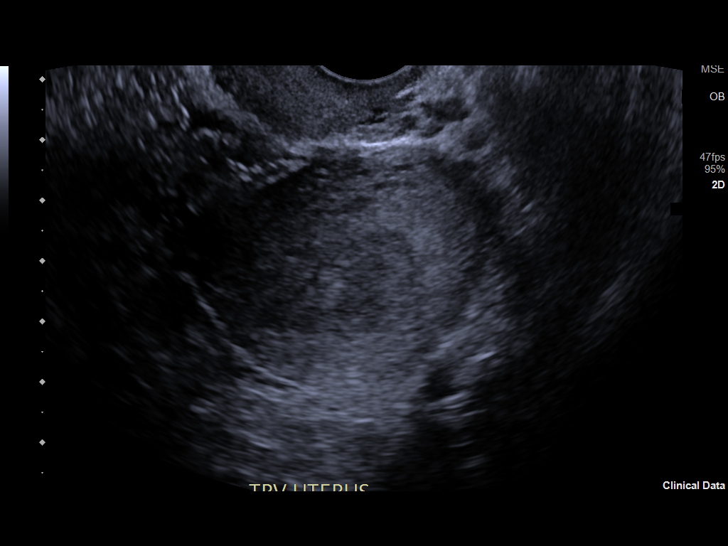
[im 36/69]
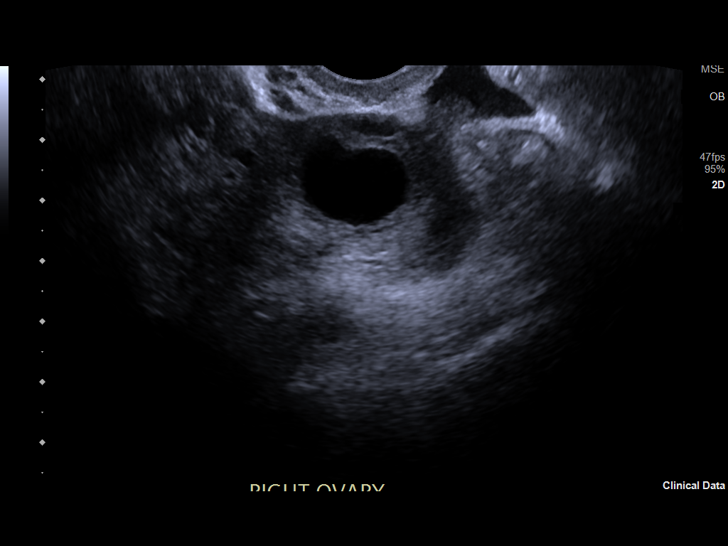
[im 41/69]
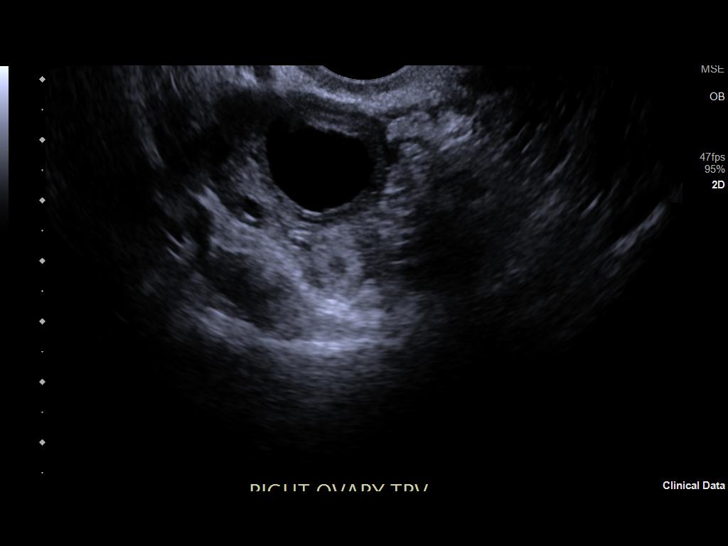
[im 46/69]
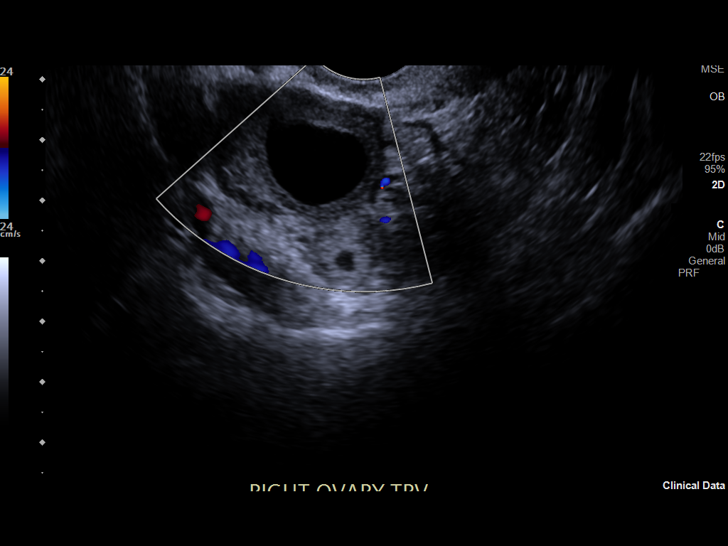
[im 51/69]
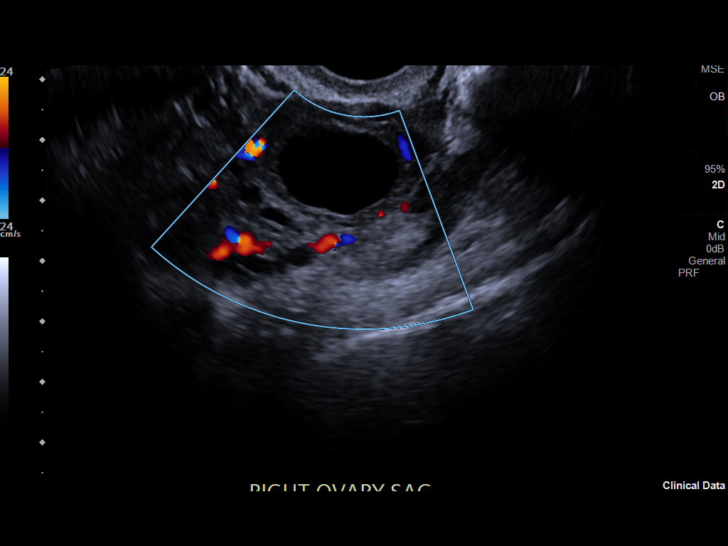
[im 56/69]
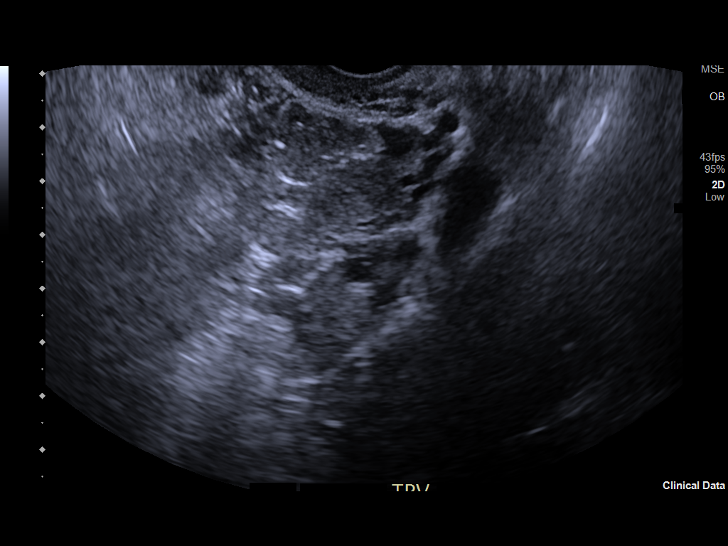
[im 61/69]
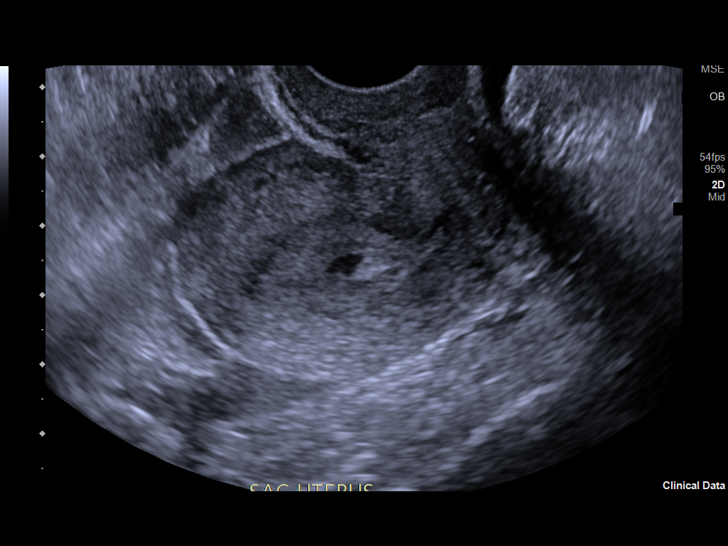
[im 66/69]
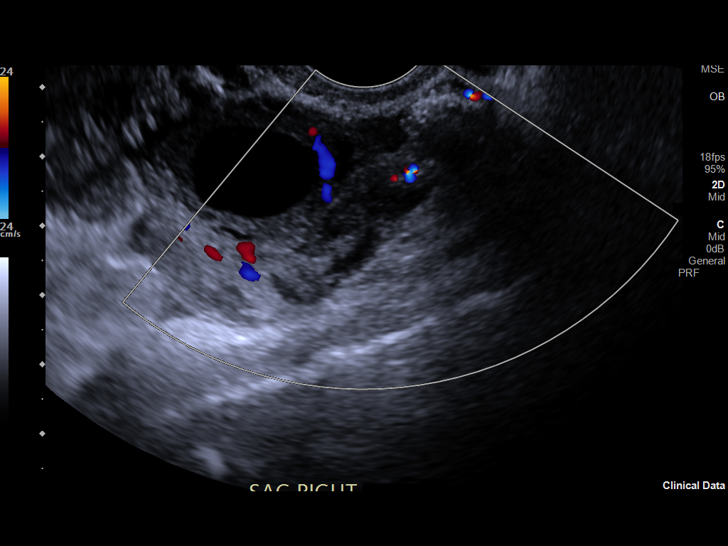

[13 of 28 positions shown; findings below may reference images not displayed]

FINDINGS: Intrauterine gestational sac: None

Yolk sac:  Not Visualized.

Embryo:  Not Visualized.

Cardiac Activity: Not Visualized.

Maternal uterus/adnexae:

Nonspecific slightly heterogeneous endometrium measuring 12 mm. The
uterus is otherwise unremarkable.

The left ovary and adnexa are unremarkable.

The right ovary is unremarkable with an adjacent right adnexal
echogenic lesion with an anechoic center that resembles a decidual
sac. Associated mild vascularity to the lesion is noted.

Other: At least trace small volume simple free fluid within the
right adnexa and pelvis.
IMPRESSION: 1. Right adnexal echogenic lesion suspicious for an unruptured
ectopic pregnancy.
2. At least trace small volume simple free fluid within the right
adnexa and pelvis. Finding may be physiologic. No definite signs of
blood products.
3. No intrauterine pregnancy identified.

These results were called by telephone at the time of interpretation
on 04/24/2022 at [DATE] to provider MOURAT DIMITROVA , who verbally
acknowledged these results.

## 2024-02-22 ENCOUNTER — Ambulatory Visit: Payer: Managed Care, Other (non HMO) | Admitting: Gastroenterology

## 2024-02-24 IMAGING — US US OB < 14 WEEKS - US OB TV
1 series · 13 of 28 positions shown · non-contrast
Comparison: 04/24/2022

CLINICAL DATA: Follow-up ectopic pregnancy

EXAM:
OBSTETRIC <14 WK US AND TRANSVAGINAL OB US
TECHNIQUE: Both transabdominal and transvaginal ultrasound examinations were
performed for complete evaluation of the gestation as well as the
maternal uterus, adnexal regions, and pelvic cul-de-sac.
Transvaginal technique was performed to assess early pregnancy.

[Series 1: us ob less than 14 weeks with ob transvaginal · 13 of 122 slices shown]
[im 5/122]
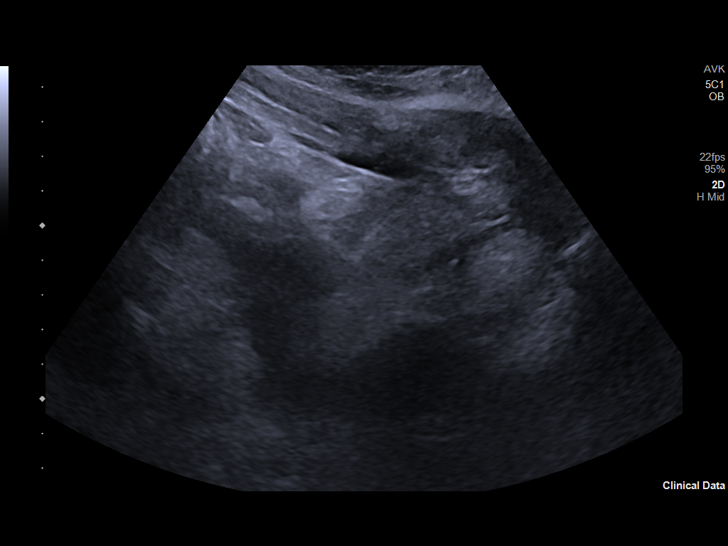
[im 14/122]
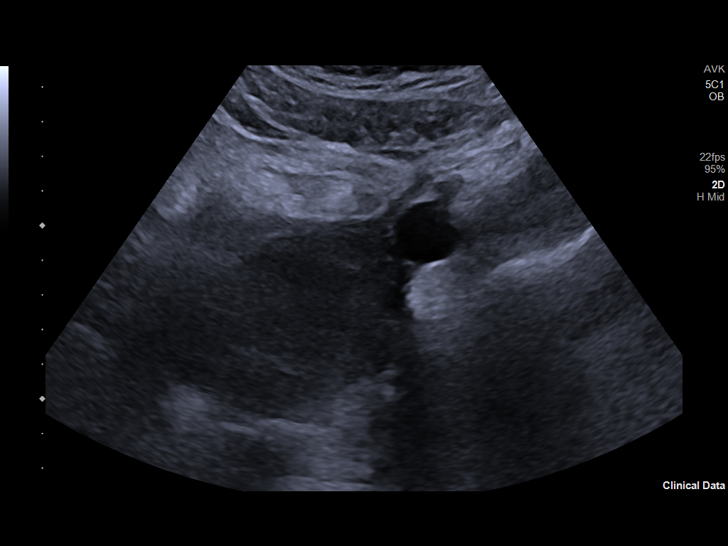
[im 23/122]
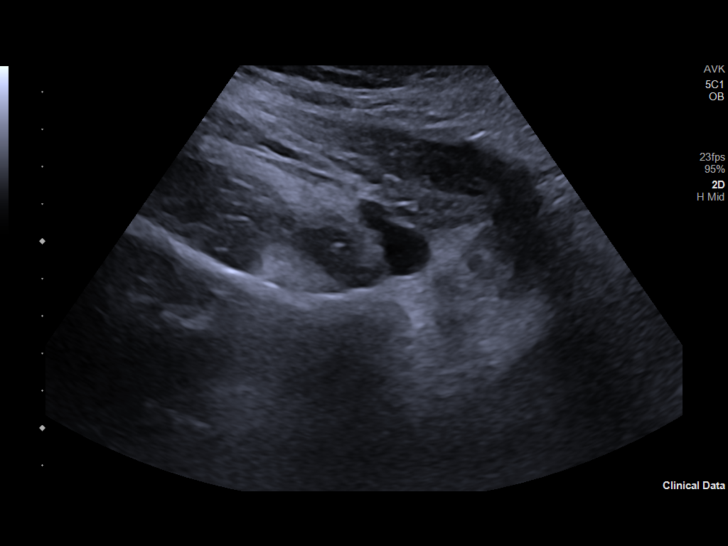
[im 32/122]
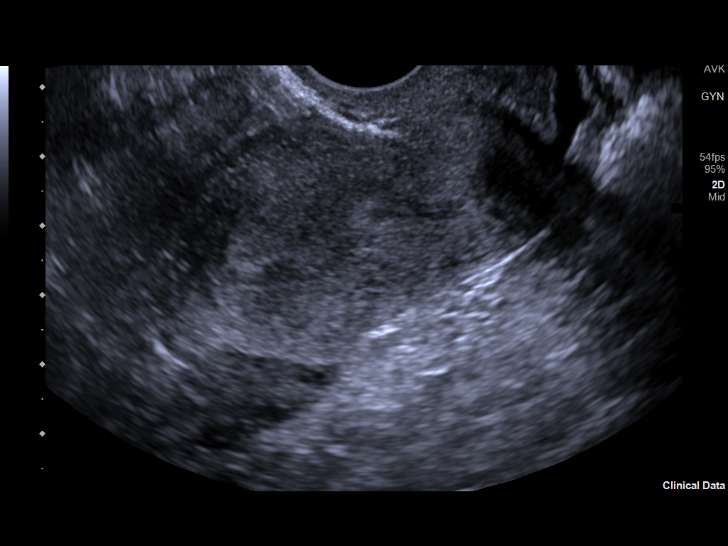
[im 41/122]
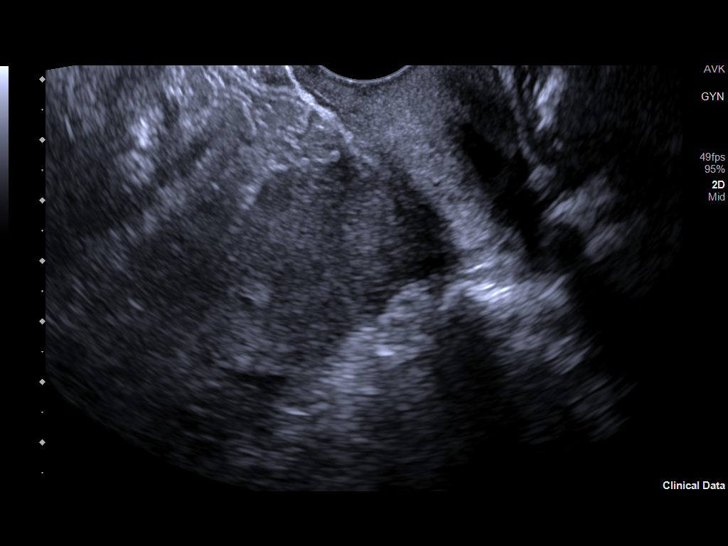
[im 50/122]
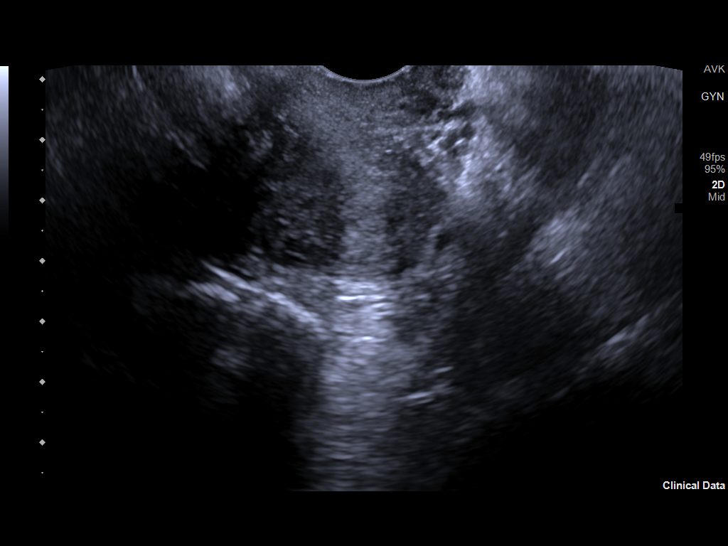
[im 63/122]
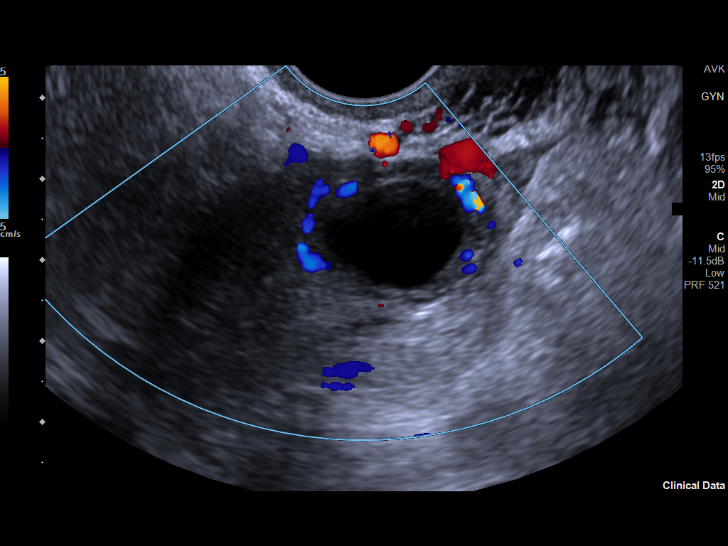
[im 72/122]
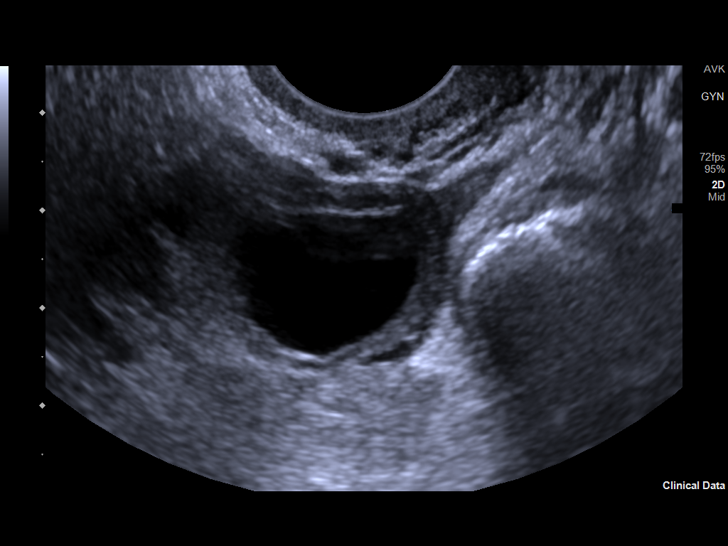
[im 81/122]
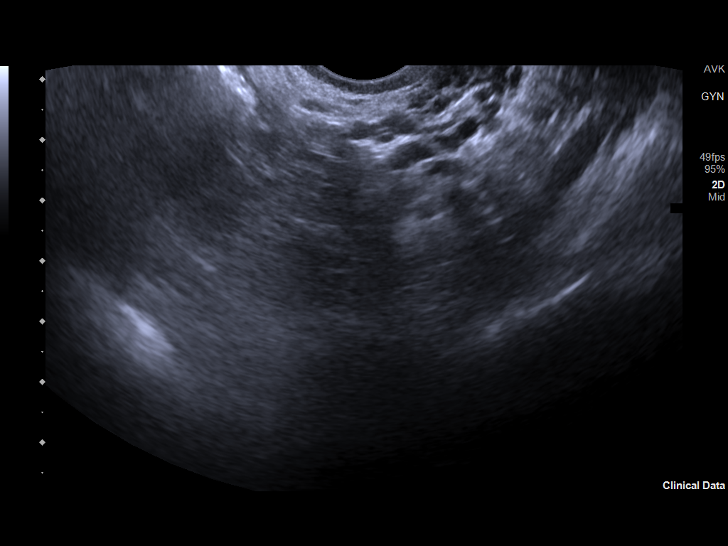
[im 90/122]
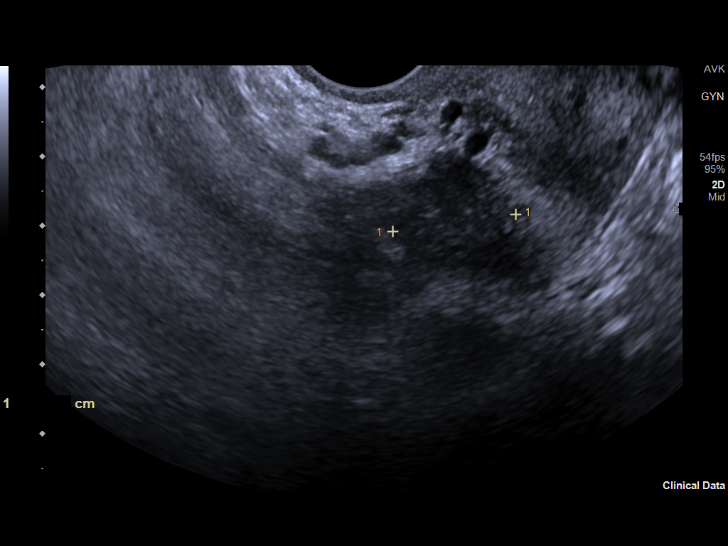
[im 99/122]
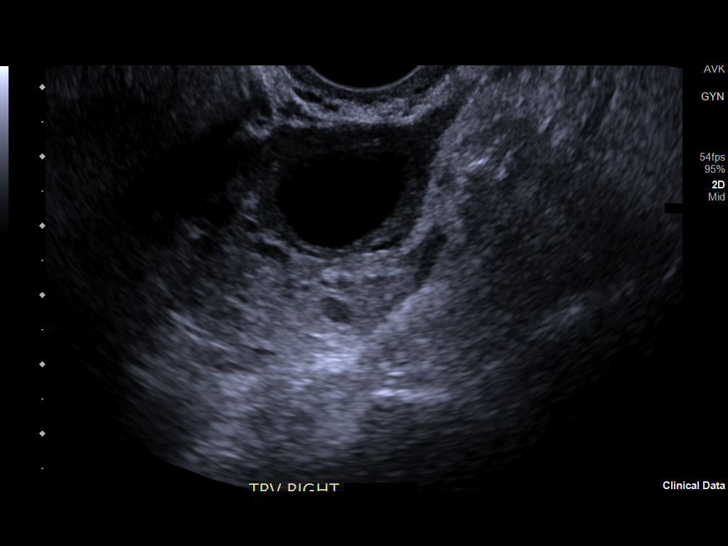
[im 108/122]
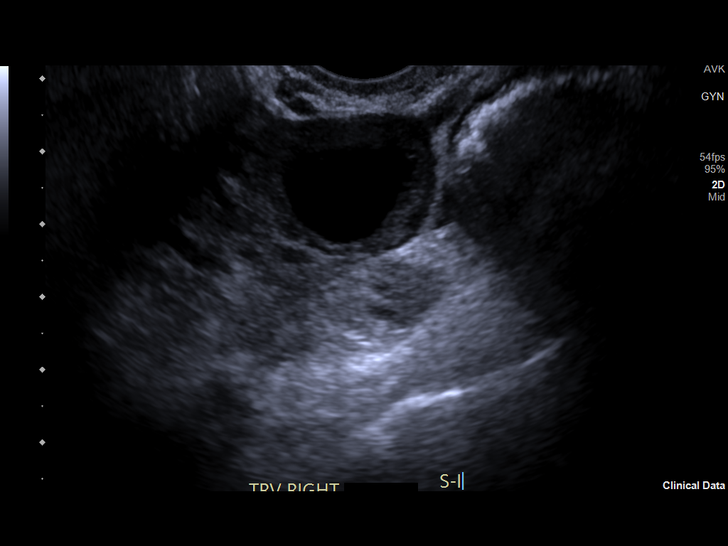
[im 117/122]
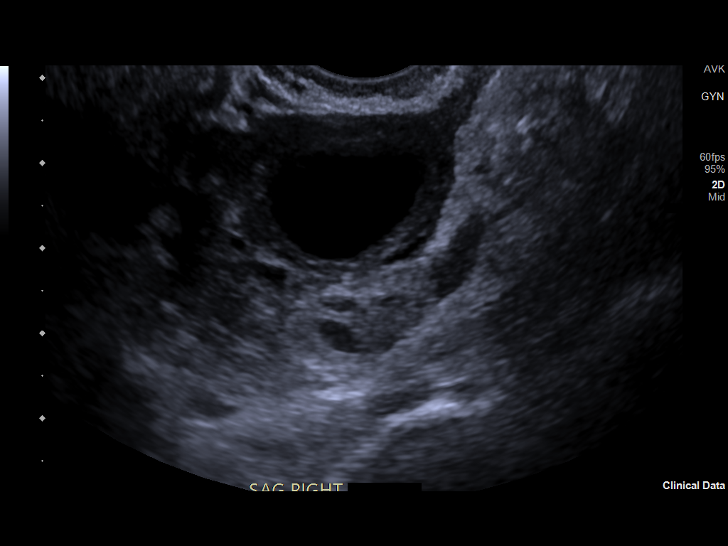

[13 of 28 positions shown; findings below may reference images not displayed]

FINDINGS: Intrauterine gestational sac: Absent, see below

Yolk sac:  N/A

Embryo:  N/A

Cardiac Activity: N/A

Heart Rate: N/A  bpm

MSD:   mm    w     d high

CRL:    mm    w    d                  US EDC:

Subchorionic hemorrhage:  N/A

Maternal uterus/adnexae:

Uterus anteverted, unremarkable.

Corpus luteal cyst within RIGHT ovary, ovaries otherwise
unremarkable.

Trace nonspecific simple appearing free pelvic fluid.

Again identified mass in RIGHT adnexa adjacent to RIGHT ovary, 12 x
12 x 11 mm, ring like morphology with central fluid/gestational sac,
appearance consistent with RIGHT adnexal ectopic pregnancy, slightly
increased in size from previous study; no fetal pole or yolk sac
visualized.
IMPRESSION: Slight increase in size of RIGHT adnexal ectopic pregnancy since
prior study.

Remainder of exam unremarkable.

Critical Value/emergent results were called by telephone at the time
of interpretation on 04/27/2022 at [DATE] to provider RELINDAS AUAD ,
who verbally acknowledged these results.

## 2024-02-29 ENCOUNTER — Ambulatory Visit: Admitting: Certified Nurse Midwife

## 2024-03-11 ENCOUNTER — Other Ambulatory Visit: Payer: Self-pay

## 2024-03-11 ENCOUNTER — Telehealth (INDEPENDENT_AMBULATORY_CARE_PROVIDER_SITE_OTHER): Payer: Managed Care, Other (non HMO) | Admitting: Internal Medicine

## 2024-03-11 DIAGNOSIS — F32A Depression, unspecified: Secondary | ICD-10-CM | POA: Diagnosis not present

## 2024-03-11 DIAGNOSIS — F909 Attention-deficit hyperactivity disorder, unspecified type: Secondary | ICD-10-CM

## 2024-03-11 DIAGNOSIS — E669 Obesity, unspecified: Secondary | ICD-10-CM

## 2024-03-11 DIAGNOSIS — F339 Major depressive disorder, recurrent, unspecified: Secondary | ICD-10-CM

## 2024-03-11 DIAGNOSIS — F419 Anxiety disorder, unspecified: Secondary | ICD-10-CM

## 2024-03-11 MED ORDER — TIRZEPATIDE-WEIGHT MANAGEMENT 2.5 MG/0.5ML ~~LOC~~ SOLN: 2.5000 mg | SUBCUTANEOUS | 0 refills | Status: DC

## 2024-03-11 MED ORDER — TIRZEPATIDE-WEIGHT MANAGEMENT 2.5 MG/0.5ML ~~LOC~~ SOAJ: 2.5000 mg | SUBCUTANEOUS | 0 refills | Status: DC

## 2024-03-11 NOTE — Progress Notes (Signed)
 Virtual Visit via Video Note  I connected with Amanda Summers on 03/11/24 at 10:40 AM EDT by a video enabled telemedicine application and verified that I am speaking with the correct person using two identifiers.  Location: Patient: Home Provider: [facility]   I discussed the limitations of evaluation and management by telemedicine and the availability of in person appointments. The patient expressed understanding and agreed to proceed.  History of Present Illness:  Patient presents for telemedicine visit to follow up on new medication.  Discussed the use of AI scribe software for clinical note transcription with the patient, who gave verbal consent to proceed.  History of Present Illness [name] is a 28 year old female who presents for follow-up on Wellbutrin  treatment for depression, anxiety, and ADHD symptoms.  She started Wellbutrin  in February and reports improvement in depression, anxiety, and ADHD symptoms without side effects. She is on 150 mg and is satisfied with this dosage.  She inquires about weight loss options, preferring oral medications over injections. She has a history of prediabetes with an improved A1c of 5.4. Her cholesterol levels are good.    Observations/Objective:  General: well appearing, no acute distress ENT: conjunctiva normal appearing bilaterally  Skin: no rashes, cyanosis or abnormal bruising noted Neuro: answers all questions appropriately   Assessment and Plan:  Assessment & Plan Obesity Focused on weight loss medications. Discussed GLP-1 receptor agonists, Zepbound and Wegovy, as effective options requiring insurance coverage. Emphasized lifestyle changes for maintenance. Explained potential side effects. - Prescribe Zepbound. If not covered, prescribe Wegovy. - Discuss savings programs if insurance does not cover the medication. - Schedule follow-up in three weeks to assess tolerance and effectiveness.  Depression and Anxiety Managed with  Wellbutrin  since February. Reports improvement and no side effects. Current dose effective. - Continue Wellbutrin  150 mg. - Refill prescription if needed.  ADHD Symptoms managed with Wellbutrin . Reports improvement. - Continue Wellbutrin  150 mg.   - tirzepatide (ZEPBOUND) 2.5 MG/0.5ML Pen; Inject 2.5 mg into the skin once a week.  Dispense: 2 mL; Refill: 0  Follow Up Instructions: 6 months for follow up - 3 weeks if weight loss medication started    I discussed the assessment and treatment plan with the patient. The patient was provided an opportunity to ask questions and all were answered. The patient agreed with the plan and demonstrated an understanding of the instructions.   The patient was advised to call back or seek an in-person evaluation if the symptoms worsen or if the condition fails to improve as anticipated.  I provided 10 minutes of non-face-to-face time during this encounter.   Rockney Cid, DO

## 2024-03-12 ENCOUNTER — Encounter: Payer: Self-pay | Admitting: Internal Medicine

## 2024-03-14 ENCOUNTER — Ambulatory Visit
Admission: RE | Admit: 2024-03-14 | Payer: Managed Care, Other (non HMO) | Source: Home / Self Care | Admitting: Gastroenterology

## 2024-03-14 ENCOUNTER — Other Ambulatory Visit: Payer: Self-pay

## 2024-03-14 ENCOUNTER — Encounter: Payer: Self-pay | Admitting: Internal Medicine

## 2024-03-14 ENCOUNTER — Telehealth: Payer: Self-pay

## 2024-03-14 ENCOUNTER — Telehealth: Admitting: Internal Medicine

## 2024-03-14 DIAGNOSIS — Z0289 Encounter for other administrative examinations: Secondary | ICD-10-CM

## 2024-03-14 SURGERY — COLONOSCOPY WITH PROPOFOL
Anesthesia: General

## 2024-03-14 NOTE — Progress Notes (Signed)
 Virtual Visit via Video Note  I connected with Amanda Summers on 03/14/24 at  3:20 PM EDT by a video enabled telemedicine application and verified that I am speaking with the correct person using two identifiers.  Location: Patient: Home Provider: [facility]   I discussed the limitations of evaluation and management by telemedicine and the availability of in person appointments. The patient expressed understanding and agreed to proceed.  History of Present Illness:  Patient with questions today regarding FMLA, will make a separate appointment so we can fill out paperwork.    No charge visit.   Rockney Cid, DO

## 2024-03-14 NOTE — OR Nursing (Signed)
 Called yesterday at 1526 to cancel appt. For procedure, mentionned to her to call office.

## 2024-03-14 NOTE — Telephone Encounter (Signed)
 Patient lvm yesterday in Endo stating that she wanted to cancel her colonoscopy that is scheduled for today 03/14/24 with Dr. Baldomero Bone.  Carolle, RN from Endo notified me about this.  Informed Carolle to go ahead and cancel per patient's request.  Thanks, Moira Andrews, CMA

## 2024-03-18 ENCOUNTER — Ambulatory Visit: Admitting: Certified Nurse Midwife

## 2024-03-21 ENCOUNTER — Encounter: Payer: Self-pay | Admitting: Certified Nurse Midwife

## 2024-05-15 ENCOUNTER — Telehealth: Payer: Self-pay | Admitting: Pharmacy Technician

## 2024-05-15 ENCOUNTER — Other Ambulatory Visit (HOSPITAL_COMMUNITY): Payer: Self-pay

## 2024-05-15 NOTE — Telephone Encounter (Signed)
 Insurance companies are becoming increasingly stricter about requiring thorough documentation of lifestyle modifications in the patient's chart at each visit. This includes detailed records of diet recommendations (caloric intake, etc), exercise plans (amount of time/wk, etc), and an emphasis on the patient's commitment to continuing these efforts while on medication.  Without this additional documentation in the chart notes, a prior authorization will most likely be denied.   Also we need a current weight and BMI and [employer] plan requires the following below:

## 2024-05-15 NOTE — Telephone Encounter (Signed)
 Patient mbs per insurance to document weight

## 2024-05-16 NOTE — Telephone Encounter (Signed)
Lvm for pt to return call to schedule appointment.  

## 2024-05-24 ENCOUNTER — Other Ambulatory Visit (HOSPITAL_COMMUNITY): Payer: Self-pay

## 2024-06-13 ENCOUNTER — Encounter: Payer: Self-pay | Admitting: Internal Medicine

## 2024-06-14 ENCOUNTER — Other Ambulatory Visit: Payer: Self-pay | Admitting: Internal Medicine

## 2024-06-14 MED ORDER — TIRZEPATIDE-WEIGHT MANAGEMENT 2.5 MG/0.5ML ~~LOC~~ SOAJ: 2.5000 mg | SUBCUTANEOUS | 0 refills | Status: DC

## 2024-06-17 ENCOUNTER — Other Ambulatory Visit (HOSPITAL_COMMUNITY): Payer: Self-pay

## 2024-07-23 ENCOUNTER — Encounter: Payer: Self-pay | Admitting: Internal Medicine

## 2024-07-25 NOTE — Telephone Encounter (Signed)
 Please schedule her appointment

## 2024-07-29 ENCOUNTER — Ambulatory Visit: Admitting: Certified Nurse Midwife

## 2024-08-01 ENCOUNTER — Ambulatory Visit: Admitting: Certified Nurse Midwife

## 2024-08-08 ENCOUNTER — Ambulatory Visit: Admitting: Internal Medicine

## 2024-08-20 ENCOUNTER — Other Ambulatory Visit: Payer: Self-pay

## 2024-08-20 ENCOUNTER — Ambulatory Visit (INDEPENDENT_AMBULATORY_CARE_PROVIDER_SITE_OTHER): Admitting: Internal Medicine

## 2024-08-20 ENCOUNTER — Encounter: Payer: Self-pay | Admitting: Internal Medicine

## 2024-08-20 DIAGNOSIS — F339 Major depressive disorder, recurrent, unspecified: Secondary | ICD-10-CM

## 2024-08-20 DIAGNOSIS — F909 Attention-deficit hyperactivity disorder, unspecified type: Secondary | ICD-10-CM

## 2024-08-20 MED ORDER — BUPROPION HCL ER (XL) 150 MG PO TB24: 150.0000 mg | ORAL_TABLET | Freq: Every day | ORAL | 1 refills | Status: DC

## 2024-08-20 MED ORDER — TIRZEPATIDE-WEIGHT MANAGEMENT 2.5 MG/0.5ML ~~LOC~~ SOAJ: 2.5000 mg | SUBCUTANEOUS | 0 refills | Status: DC

## 2024-08-20 NOTE — Telephone Encounter (Signed)
 Patient to make virtual appointment

## 2024-08-20 NOTE — Progress Notes (Signed)
 Established Patient Office Visit  Subjective    Patient ID: Amanda Summers, female    DOB: 1995/12/28  Age: 28 y.o. MRN: [id number]  CC:  Chief Complaint  Patient presents with   Obesity    Discuss Zepbound     HPI [name] presents to follow up on chronic medical conditions.   Discussed the use of AI scribe software for clinical note transcription with the patient, who gave verbal consent to proceed.  History of Present Illness [name] is a 28 year old female with anxiety, PTSD, and depression who presents for medication management and FMLA paperwork.  She is following up on her medication, Zepbound , which she used for weight management. She was on a 2.5 mg dose for four weeks and tolerated it well, but has been without it for an equivalent period. Her insurance initially covered the medication with a $25 copay.  She needs FMLA paperwork completed for her anxiety, PTSD, and depression, with a deadline of 15 days. This paperwork is required every six months.  She no longer has the Nexplanon  implant, which was removed, leaving some residual bruising. She continues to take Wellbutrin  and is stable on her current dose.  She works for [employer], and her labs will be ordered through [employer].  ADHD/Depression/Anxiety: -Had been on stimulant before but did not like it because it caused insomnia -Had been on Lexapro and Zoloft before but not on anything currently -Not interested in anything that will cause weight gain -Now on Wellbutrin  150 mg XL and doing well      08/20/2024    2:57 PM 03/11/2024    9:33 AM 10/30/2023    9:43 AM  Depression screen PHQ 2/9  Decreased Interest 0 0 1  Down, Depressed, Hopeless 0 0 1  PHQ - 2 Score 0 0 2  Altered sleeping  0 1  Tired, decreased energy  0 1  Change in appetite  0 2  Feeling bad or failure about yourself   0 2  Trouble concentrating  0 2  Moving slowly or fidgety/restless  0 1  Suicidal thoughts  0 0  PHQ-9 Score  0 11   Difficult doing work/chores  Not difficult at all Somewhat difficult   Health Maintenance: -Blood work UTD -Pap 9/24 negative    Outpatient Encounter Medications as of 08/20/2024  Medication Sig   buPROPion  (WELLBUTRIN  XL) 150 MG 24 hr tablet Take 1 tablet (150 mg total) by mouth daily.   Etonogestrel  (NEXPLANON  [place]) Inject into the skin. (Patient not taking: Reported on 08/20/2024)   tirzepatide  (ZEPBOUND ) 2.5 MG/0.5ML Pen Inject 2.5 mg into the skin once a week. (Patient not taking: Reported on 08/20/2024)   No facility-administered encounter medications on file as of 08/20/2024.    Past Medical History:  Diagnosis Date   ADHD    Anxiety    Depression    Ectopic pregnancy    2023   GERD (gastroesophageal reflux disease)    No known health problems     Past Surgical History:  Procedure Laterality Date   DIAGNOSTIC LAPAROSCOPY WITH REMOVAL OF ECTOPIC PREGNANCY Right 05/10/2022   Procedure: DIAGNOSTIC LAPAROSCOPY WITH REMOVAL OF ECTOPIC PREGNANCY;  Surgeon: Leonce Garnette BIRCH, MD;  Location: [facility] ORS;  Service: Gynecology;  Laterality: Right;   FOOT SURGERY     LAPAROSCOPIC UNILATERAL SALPINGECTOMY Right 05/10/2022   Procedure: LAPAROSCOPIC UNILATERAL SALPINGECTOMY;  Surgeon: Leonce Garnette BIRCH, MD;  Location: [facility] ORS;  Service: Gynecology;  Laterality: Right;    Family  History  Problem Relation Age of Onset   Cancer Neg Hx    Breast cancer Neg Hx    Ovarian cancer Neg Hx     Social History   Socioeconomic History   Marital status: Divorced    Spouse name: Not on file   Number of children: Not on file   Years of education: Not on file   Highest education level: Associate degree: academic program  Occupational History   Not on file  Tobacco Use   Smoking status: Never    Passive exposure: Never   Smokeless tobacco: Never  Vaping Use   Vaping status: Never Used  Substance and Sexual Activity   Alcohol use: Yes    Comment: occasinal   Drug use: Never   Sexual  activity: Yes    Birth control/protection: I.U.D.  Other Topics Concern   Not on file  Social History Narrative   Works as a Community education officer at [employer]   Social Drivers of [employer]: Low Risk  (08/20/2024)   Overall Financial Resource Strain (CARDIA)    Difficulty of Paying Living Expenses: Not very hard  Food Insecurity: Food Insecurity Present (08/20/2024)   Hunger Vital Sign    Worried About Running Out of Food in the Last Year: Sometimes true    Ran Out of Food in the Last Year: Never true  Transportation Needs: No Transportation Needs (08/20/2024)   PRAPARE - Administrator, Civil Service (Medical): No    Lack of Transportation (Non-Medical): No  Physical Activity: Sufficiently Active (08/20/2024)   Exercise Vital Sign    Days of Exercise per Week: 7 days    Minutes of Exercise per Session: 30 min  Stress: Stress Concern Present (08/20/2024)   [employer] of Occupational Health - Occupational Stress Questionnaire    Feeling of Stress: Very much  Social Connections: Moderately Isolated (08/20/2024)   Social Connection and Isolation Panel    Frequency of Communication with Friends and Family: More than three times a week    Frequency of Social Gatherings with Friends and Family: Once a week    Attends Religious Services: Patient declined    Database administrator or Organizations: No    Attends Engineer, structural: Not on file    Marital Status: Living with partner  Intimate Partner Violence: Not on file    Review of Systems  All other systems reviewed and are negative.       Objective    BP 122/84 (Cuff Size: Large)   Pulse 100   Temp 98.2 F (36.8 C) (Oral)   Resp 16   Ht 5' 7 (1.702 m)   Wt 272 lb 8 oz (123.6 kg)   SpO2 99%   BMI 42.68 kg/m   Physical Exam Constitutional:      Appearance: Normal appearance. She is obese.  HENT:     Head: Normocephalic and atraumatic.  Eyes:     Conjunctiva/sclera:  Conjunctivae normal.  Cardiovascular:     Rate and Rhythm: Normal rate and regular rhythm.  Pulmonary:     Effort: Pulmonary effort is normal.     Breath sounds: Normal breath sounds.  Skin:    General: Skin is warm and dry.  Neurological:     General: No focal deficit present.     Mental Status: She is alert. Mental status is at baseline.  Psychiatric:        Mood and Affect: Mood normal.  Behavior: Behavior normal.         Assessment & Plan:   Assessment & Plan Obesity She tolerated Zepbound  2.5 mg well but discontinued because she ran out of medication.  - Prescribe Zepbound  2.5 mg for 3-4 weeks. - Schedule follow-up in four weeks to assess tolerance and consider dose increase.  Anxiety disorder and post-traumatic stress disorder She needs FMLA paperwork for anxiety, PTSD, and depression. She is stable on Wellbutrin . - Refill Wellbutrin .  - Schedule virtual appointment on Friday at 11:20 AM to complete FMLA paperwork. - Send FMLA forms via MyChart for review prior to the appointment.   - tirzepatide  (ZEPBOUND ) 2.5 MG/0.5ML Pen; Inject 2.5 mg into the skin once a week.  Dispense: 2 mL; Refill: 0 - buPROPion  (WELLBUTRIN  XL) 150 MG 24 hr tablet; Take 1 tablet (150 mg total) by mouth daily.  Dispense: 90 tablet; Refill: 1  Return in about 4 weeks (around 09/17/2024) for 1 month for medication recheck, Friday virtual for FMLA .   Sharyle Fischer, DO

## 2024-08-23 ENCOUNTER — Telehealth (INDEPENDENT_AMBULATORY_CARE_PROVIDER_SITE_OTHER): Admitting: Internal Medicine

## 2024-08-23 ENCOUNTER — Other Ambulatory Visit: Payer: Self-pay

## 2024-08-23 DIAGNOSIS — F4310 Post-traumatic stress disorder, unspecified: Secondary | ICD-10-CM

## 2024-08-23 DIAGNOSIS — Z0289 Encounter for other administrative examinations: Secondary | ICD-10-CM

## 2024-08-23 DIAGNOSIS — F331 Major depressive disorder, recurrent, moderate: Secondary | ICD-10-CM

## 2024-08-23 DIAGNOSIS — F419 Anxiety disorder, unspecified: Secondary | ICD-10-CM | POA: Diagnosis not present
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
## 2024-09-23 ENCOUNTER — Other Ambulatory Visit: Payer: Self-pay

## 2024-09-23 ENCOUNTER — Ambulatory Visit (INDEPENDENT_AMBULATORY_CARE_PROVIDER_SITE_OTHER): Admitting: Internal Medicine

## 2024-09-23 DIAGNOSIS — F419 Anxiety disorder, unspecified: Secondary | ICD-10-CM | POA: Diagnosis not present

## 2024-09-23 DIAGNOSIS — F4310 Post-traumatic stress disorder, unspecified: Secondary | ICD-10-CM | POA: Diagnosis not present

## 2024-09-23 MED ORDER — ZEPBOUND 5 MG/0.5ML ~~LOC~~ SOAJ: 5.0000 mg | SUBCUTANEOUS | 0 refills | Status: DC

## 2024-09-23 NOTE — Progress Notes (Signed)
 Established Patient Office Visit  Subjective    Patient ID: Amanda Summers, female    DOB: 01-Aug-1996  Age: 28 y.o. MRN: [id number]  CC:  Chief Complaint  Patient presents with   Follow-up    obesity    HPI Amanda Summers presents to follow up on chronic medical conditions.   Discussed the use of AI scribe software for clinical note transcription with the patient, who gave verbal consent to proceed.  History of Present Illness  Amanda Summers is a 28 year old female who presents for weight management follow-up.  She is currently taking Zepbound  at a dose of 2.5 mg for weight management. Her weight has decreased from 272 pounds on August 20, 2024, to 263 pounds, with a home weight of 260 pounds this morning. She has lost approximately 10 pounds in the past month. She is experiencing constipation.  She has PTSD and anxiety, requiring a letter for educational accommodations. Her anxiety is worsened by the testing environment, which includes camera monitoring during exams. She has been diagnosed with generalized anxiety and major depressive disorder by a psychiatrist.  ADHD/Depression/Anxiety: -Had been on stimulant before but did not like it because it caused insomnia -Had been on Lexapro and Zoloft before but not on anything currently -Not interested in anything that will cause weight gain -Now on Wellbutrin  150 mg XL and doing well      08/20/2024    2:57 PM 03/11/2024    9:33 AM 10/30/2023    9:43 AM  Depression screen PHQ 2/9  Decreased Interest 0 0 1  Down, Depressed, Hopeless 0 0 1  PHQ - 2 Score 0 0 2  Altered sleeping  0 1  Tired, decreased energy  0 1  Change in appetite  0 2  Feeling bad or failure about yourself   0 2  Trouble concentrating  0 2  Moving slowly or fidgety/restless  0 1  Suicidal thoughts  0 0  PHQ-9 Score  0  11   Difficult doing work/chores  Not difficult at all Somewhat difficult     Data saved with a previous flowsheet row definition    Health Maintenance: -Blood work UTD -Pap 9/24 negative    Outpatient Encounter Medications as of 09/23/2024  Medication Sig   buPROPion  (WELLBUTRIN  XL) 150 MG 24 hr tablet Take 1 tablet (150 mg total) by mouth daily.   tirzepatide  (ZEPBOUND ) 2.5 MG/0.5ML Pen Inject 2.5 mg into the skin once a week.   No facility-administered encounter medications on file as of 09/23/2024.    Past Medical History:  Diagnosis Date   ADHD    Anxiety    Depression    Ectopic pregnancy    2023   GERD (gastroesophageal reflux disease)    No known health problems     Past Surgical History:  Procedure Laterality Date   DIAGNOSTIC LAPAROSCOPY WITH REMOVAL OF ECTOPIC PREGNANCY Right 05/10/2022   Procedure: DIAGNOSTIC LAPAROSCOPY WITH REMOVAL OF ECTOPIC PREGNANCY;  Surgeon: Leonce Garnette BIRCH, MD;  Location: [facility] ORS;  Service: Gynecology;  Laterality: Right;   FOOT SURGERY     LAPAROSCOPIC UNILATERAL SALPINGECTOMY Right 05/10/2022   Procedure: LAPAROSCOPIC UNILATERAL SALPINGECTOMY;  Surgeon: Leonce Garnette BIRCH, MD;  Location: [facility] OR[facility];  Service: Gynecology;  Laterality: Right;    Family History  Problem Relation Age of Onset   Cancer Neg Hx    Breast cancer Neg Hx    Ovarian cancer Neg Hx     Social History   Socioeconomic  History   Marital status: Divorced    Spouse name: Not on file   Number of children: Not on file   Years of education: Not on file   Highest education level: Associate degree: academic program  Occupational History   Not on file  Tobacco Use   Smoking status: Never    Passive exposure: Never   Smokeless tobacco: Never  Vaping Use   Vaping status: Never Used  Substance and Sexual Activity   Alcohol use: Yes    Comment: occasinal   Drug use: Never   Sexual activity: Yes    Birth control/protection: I.U.D.  Other Topics Concern   Not on file  Social History Narrative   Works as a community education officer at [employer]   Social Drivers of [employer]:  Low Risk  (08/20/2024)   Overall Financial Resource Strain (CARDIA)    Difficulty of Paying Living Expenses: Not very hard  Food Insecurity: Food Insecurity Present (08/20/2024)   Hunger Vital Sign    Worried About Running Out of Food in the Last Year: Sometimes true    Ran Out of Food in the Last Year: Never true  Transportation Needs: No Transportation Needs (08/20/2024)   PRAPARE - Administrator, Civil Service (Medical): No    Lack of Transportation (Non-Medical): No  Physical Activity: Sufficiently Active (08/20/2024)   Exercise Vital Sign    Days of Exercise per Week: 7 days    Minutes of Exercise per Session: 30 min  Stress: Stress Concern Present (08/20/2024)   [employer] of Occupational Health - Occupational Stress Questionnaire    Feeling of Stress: Very much  Social Connections: Moderately Isolated (08/20/2024)   Social Connection and Isolation Panel    Frequency of Communication with Friends and Family: More than three times a week    Frequency of Social Gatherings with Friends and Family: Once a week    Attends Religious Services: Patient declined    Database Administrator or Organizations: No    Attends Engineer, Structural: Not on file    Marital Status: Living with partner  Intimate Partner Violence: Not on file    Review of Systems  Gastrointestinal:  Positive for constipation. Negative for abdominal pain, heartburn, nausea and vomiting.  All other systems reviewed and are negative.       Objective    BP 120/80 (Cuff Size: Large)   Temp 98.7 F (37.1 C) (Oral)   Resp 16   Ht 5' 7 (1.702 m)   Wt 263 lb 9.6 oz (119.6 kg)   SpO2 98%   BMI 41.29 kg/m   Physical Exam Constitutional:      Appearance: Normal appearance. She is obese.  HENT:     Head: Normocephalic and atraumatic.  Eyes:     Conjunctiva/sclera: Conjunctivae normal.  Cardiovascular:     Rate and Rhythm: Normal rate and regular rhythm.  Pulmonary:      Effort: Pulmonary effort is normal.     Breath sounds: Normal breath sounds.  Skin:    General: Skin is warm and dry.  Neurological:     General: No focal deficit present.     Mental Status: She is alert. Mental status is at baseline.  Psychiatric:        Mood and Affect: Mood normal.        Behavior: Behavior normal.         Assessment & Plan:   Assessment & Plan  Morbid  obesity due to excess calories Weight loss of 10 pounds in one month on low dose Ozempic. Insurance requires dose increase to 5 mg. Constipation noted, likely due to low fiber intake. - Increased Ozempic dose to 5 mg. - Advised starting fiber supplements, beginning with low dose and gradually increasing. - Continue to monitor weight and report any issues.  Post-traumatic stress disorder (PTSD) PTSD diagnosis confirmed. Requires documentation for school accommodations. - Provided letter confirming PTSD diagnosis for school accommodations.  Generalized anxiety disorder and major depressive disorder Diagnosis confirmed. Requires documentation for school accommodations. - Provided letter confirming diagnosis for school accommodations.  - tirzepatide  (ZEPBOUND ) 5 MG/0.5ML Pen; Inject 5 mg into the skin once a week.  Dispense: 6 mL; Refill: 0   Return in about 3 months (around 12/24/2024).   Sharyle Fischer, DO

## 2024-09-25 ENCOUNTER — Encounter: Payer: Self-pay | Admitting: Internal Medicine

## 2024-10-05 ENCOUNTER — Emergency Department

## 2024-10-05 ENCOUNTER — Emergency Department
Admission: EM | Admit: 2024-10-05 | Discharge: 2024-10-05 | Disposition: A | Discharge status: Home / Self Care | Attending: Emergency Medicine | Admitting: Emergency Medicine

## 2024-10-05 ENCOUNTER — Other Ambulatory Visit: Payer: Self-pay

## 2024-10-05 DIAGNOSIS — O26891 Other specified pregnancy related conditions, first trimester: Secondary | ICD-10-CM | POA: Diagnosis present

## 2024-10-05 DIAGNOSIS — Z3A01 Less than 8 weeks gestation of pregnancy: Secondary | ICD-10-CM | POA: Diagnosis not present

## 2024-10-05 DIAGNOSIS — R1032 Left lower quadrant pain: Secondary | ICD-10-CM | POA: Diagnosis not present

## 2024-10-05 DIAGNOSIS — R109 Unspecified abdominal pain: Secondary | ICD-10-CM

## 2024-10-05 LAB — COMPREHENSIVE METABOLIC PANEL WITH GFR
ALT: 22 U/L (ref 0–44)
AST: 23 U/L (ref 15–41)
Albumin: 4 g/dL (ref 3.5–5.0)
Alkaline Phosphatase: 62 U/L (ref 38–126)
Anion gap: 10 (ref 5–15)
BUN: 6 mg/dL (ref 6–20)
CO2: 22 mmol/L (ref 22–32)
Calcium: 9.3 mg/dL (ref 8.9–10.3)
Chloride: 103 mmol/L (ref 98–111)
Creatinine, Ser: 0.84 mg/dL (ref 0.44–1.00)
GFR, Estimated: >60 mL/min (ref >60)
Glucose, Bld: 146 mg/dL — ABNORMAL HIGH (ref 70–99)
Potassium: 3.7 mmol/L (ref 3.5–5.1)
Sodium: 135 mmol/L (ref 135–145)
Total Bilirubin: 0.5 mg/dL (ref 0.0–1.2)
Total Protein: 7.8 g/dL (ref 6.5–8.1)

## 2024-10-05 LAB — CBC
HCT: 36.9 % (ref 36.0–46.0)
Hemoglobin: 11.6 g/dL — ABNORMAL LOW (ref 12.0–15.0)
MCH: 24.4 pg — ABNORMAL LOW (ref 26.0–34.0)
MCHC: 31.4 g/dL (ref 30.0–36.0)
MCV: 77.7 fL — ABNORMAL LOW (ref 80.0–100.0)
Platelets: 255 K/uL (ref 150–400)
RBC: 4.75 MIL/uL (ref 3.87–5.11)
RDW: 15.3 % (ref 11.5–15.5)
WBC: 4.2 K/uL (ref 4.0–10.5)
nRBC: 0 % (ref 0.0–0.2)

## 2024-10-05 LAB — POC URINE PREG, ED: Preg Test, Ur: POSITIVE — AB

## 2024-10-05 LAB — LIPASE, BLOOD: Lipase: 19 U/L (ref 11–51)

## 2024-10-05 LAB — HCG, QUANTITATIVE, PREGNANCY: hCG, Beta Chain, Quant, S: 1342 m[IU]/mL — ABNORMAL HIGH (ref <5)

## 2024-10-05 LAB — URINALYSIS, ROUTINE W REFLEX MICROSCOPIC
Bilirubin Urine: NEGATIVE
Glucose, UA: NEGATIVE mg/dL
Hgb urine dipstick: NEGATIVE
Ketones, ur: NEGATIVE mg/dL
Leukocytes,Ua: NEGATIVE
Nitrite: NEGATIVE
Protein, ur: NEGATIVE mg/dL
Specific Gravity, Urine: 1.015 (ref 1.005–1.030)
pH: 6 (ref 5.0–8.0)

## 2024-10-05 NOTE — ED Provider Notes (Signed)
 [facility] Provider Note    Event Date/Time   First MD Initiated Contact with Patient 10/05/24 1213     (approximate)   History   Abdominal Pain   HPI  Amanda Summers is a 28 y.o. female G3 P0 with a past medical history of ruptured right tubal ectopic pregnancy with hemoperitoneum who presents today for evaluation of left lower quadrant pain in the setting of a positive pregnancy test.  Patient reports that her last menstrual period was 11/2.  She reports that it was shorter than usual.  She reports that she took a positive pregnancy test yesterday and earlier this week when both were positive.  She reports that her left lower quadrant abdominal pain began yesterday.  She has not had any nausea or vomiting.  No urinary symptoms.  No vaginal bleeding or spotting.  Patient Active Problem List   Diagnosis Date Noted   Anemia 10/30/2023   Recurrent major depressive disorder 10/30/2023   Attention deficit hyperactivity disorder (ADHD) 10/30/2023   Ruptured right tubal ectopic pregnancy causing hemoperitoneum 05/10/2022   Obesity, morbid (HCC) 04/08/2021          Physical Exam   Triage Vital Signs: ED Triage Vitals [10/05/24 1203]  Encounter Vitals Group     BP 133/76     Girls Systolic BP Percentile      Girls Diastolic BP Percentile      Boys Systolic BP Percentile      Boys Diastolic BP Percentile      Pulse Rate (!) 110     Resp 18     Temp 98.4 F (36.9 C)     Temp Source Oral     SpO2 100 %     Weight 260 lb (117.9 kg)     Height 5' 7 (1.702 m)     Head Circumference      Peak Flow      Pain Score 5     Pain Loc      Pain Education      Exclude from Growth Chart     Most recent vital signs: Vitals:   10/05/24 1203 10/05/24 1513  BP: 133/76 121/72  Pulse: (!) 110 90  Resp: 18 17  Temp: 98.4 F (36.9 C)   SpO2: 100% 100%    Physical Exam Vitals and nursing note reviewed.  Constitutional:      General: Awake and alert. No  acute distress.    Appearance: Normal appearance. The patient is normal weight.  HENT:     Head: Normocephalic and atraumatic.     Mouth: Mucous membranes are moist.  Eyes:     General: PERRL. Normal EOMs        Right eye: No discharge.        Left eye: No discharge.     Conjunctiva/sclera: Conjunctivae normal.  Cardiovascular:     Rate and Rhythm: Normal rate and regular rhythm.     Pulses: Normal pulses.  Pulmonary:     Effort: Pulmonary effort is normal. No respiratory distress.     Breath sounds: Normal breath sounds.  Abdominal:     Abdomen is soft. There is no abdominal tenderness. No rebound or guarding. No distention. Musculoskeletal:        General: No swelling. Normal range of motion.     Cervical back: Normal range of motion and neck supple.  Skin:    General: Skin is warm and dry.     Capillary Refill: Capillary  refill takes less than 2 seconds.     Findings: No rash.  Neurological:     Mental Status: The patient is awake and alert.      ED Results / Procedures / Treatments   Labs (all labs ordered are listed, but only abnormal results are displayed) Labs Reviewed  COMPREHENSIVE METABOLIC PANEL WITH GFR - Abnormal; Notable for the following components:      Result Value   Glucose, Bld 146 (*)    All other components within normal limits  CBC - Abnormal; Notable for the following components:   Hemoglobin 11.6 (*)    MCV 77.7 (*)    MCH 24.4 (*)    All other components within normal limits  URINALYSIS, ROUTINE W REFLEX MICROSCOPIC - Abnormal; Notable for the following components:   Color, Urine YELLOW (*)    APPearance CLEAR (*)    All other components within normal limits  HCG, QUANTITATIVE, PREGNANCY - Abnormal; Notable for the following components:   hCG, Beta Chain, Quant, S 1,342 (*)    All other components within normal limits  POC URINE PREG, ED - Abnormal; Notable for the following components:   Preg Test, Ur POSITIVE (*)    All other components  within normal limits  LIPASE, BLOOD     EKG     RADIOLOGY I independently reviewed and interpreted imaging and agree with radiologists findings.     PROCEDURES:  Critical Care performed:   Procedures   MEDICATIONS ORDERED IN ED: Medications - No data to display   IMPRESSION / MDM / ASSESSMENT AND PLAN / ED COURSE  I reviewed the triage vital signs and the nursing notes.   Differential diagnosis includes, but is not limited to, IUP, ectopic pregnancy, UTI.  Patient is awake and alert, hemodynamically stable and afebrile.  She is nontoxic in appearance.  Labs obtained are overall reassuring, no leukocytosis.  Normal H&H.  hCG is elevated, though minimally so to 1200.  Ultrasound obtained reveals findings that appear to be early IUP with a possible small gestational sac consistent with 5 weeks and 1 day.  As I discussed with the patient, there is no yolk sac, fetal pole, or cardiac activity visualized.  We discussed that we cannot definitively rule out ectopic pregnancy, and that she needs to have repeat hCG and ultrasound next week.  We discussed very strict return precautions in the meantime.  Patient was observed in the emergency department for nearly 3.5 hours and had no recurrence of pain.  Urinalysis does not reveal evidence of infection.  Patient understands and agrees with plan.  She was discharged in stable condition.   Patient's presentation is most consistent with acute complicated illness / injury requiring diagnostic workup.   Clinical Course as of 10/05/24 1659  Sat Oct 05, 2024  1455 Patient has been pain-free throughout her emergency department stay [JP]    Clinical Course User Index [JP] Jasira Robinson E, PA-C     FINAL CLINICAL IMPRESSION(S) / ED DIAGNOSES   Final diagnoses:  Abdominal pain during pregnancy in first trimester     Rx / DC Orders   ED Discharge Orders     None        Note:  This document was prepared using Dragon voice  recognition software and may include unintentional dictation errors.   Breanda Greenlaw E, PA-C 10/05/24 1659    Dorothyann Drivers, MD 10/06/24 1436

## 2024-10-05 NOTE — Discharge Instructions (Addendum)
 Please have a repeat hCG in the next couple of days, as well as a repeat ultrasound.  Please follow-up with your OB/GYN to schedule this.  Please return for any new, worsening, or changing symptoms or other concerns.  It was a pleasure caring for you today.

## 2024-10-05 NOTE — ED Triage Notes (Signed)
 Pt to ED via POV from home for Left side abd pain. Started this am, states pain localized to one area. Pt also stating is pregnant, not sure how far along but estimates about 6 weeks and has had ectopic pregnancy on R side and had tube removed. States pain 5/10. Also having some constipation and nausea but denies emesis, CP/SOB.

## 2024-10-08 ENCOUNTER — Other Ambulatory Visit: Payer: Self-pay

## 2024-10-08 ENCOUNTER — Other Ambulatory Visit

## 2024-10-08 DIAGNOSIS — O0911 Supervision of pregnancy with history of ectopic or molar pregnancy, first trimester: Secondary | ICD-10-CM

## 2024-10-08 NOTE — Progress Notes (Signed)
 Beta order placed per 10/05/24 ED visit note: Labs obtained are overall reassuring, no leukocytosis. Normal H&H. hCG is elevated, though minimally so to 1200. Ultrasound obtained reveals findings that appear to be early IUP with a possible small gestational sac consistent with 5 weeks and 1 day. As I discussed with the patient, there is no yolk sac, fetal pole, or cardiac activity visualized. We discussed that we cannot definitively rule out ectopic pregnancy, and that she needs to have repeat hCG and ultrasound next week.

## 2024-10-09 ENCOUNTER — Encounter: Payer: Self-pay | Admitting: Certified Nurse Midwife

## 2024-10-09 LAB — BETA HCG QUANT (REF LAB): hCG Quant: 4658 m[IU]/mL

## 2024-10-14 ENCOUNTER — Ambulatory Visit
Admission: RE | Admit: 2024-10-14 | Discharge: 2024-10-14 | Disposition: A | Source: Ambulatory Visit | Attending: Certified Nurse Midwife | Admitting: Certified Nurse Midwife

## 2024-10-14 DIAGNOSIS — O0911 Supervision of pregnancy with history of ectopic or molar pregnancy, first trimester: Secondary | ICD-10-CM

## 2024-10-16 ENCOUNTER — Ambulatory Visit

## 2024-10-21 ENCOUNTER — Ambulatory Visit: Payer: Self-pay | Admitting: Certified Nurse Midwife

## 2024-10-21 DIAGNOSIS — O3680X0 Pregnancy with inconclusive fetal viability, not applicable or unspecified: Secondary | ICD-10-CM

## 2024-10-25 ENCOUNTER — Ambulatory Visit

## 2024-10-25 ENCOUNTER — Ambulatory Visit (INDEPENDENT_AMBULATORY_CARE_PROVIDER_SITE_OTHER)

## 2024-10-25 ENCOUNTER — Ambulatory Visit: Admission: RE | Admit: 2024-10-25

## 2024-10-25 VITALS — BP 116/66 | HR 104 | Wt 264.1 lb

## 2024-10-25 DIAGNOSIS — Z3687 Encounter for antenatal screening for uncertain dates: Secondary | ICD-10-CM

## 2024-10-25 DIAGNOSIS — O3680X0 Pregnancy with inconclusive fetal viability, not applicable or unspecified: Secondary | ICD-10-CM | POA: Diagnosis not present

## 2024-10-25 DIAGNOSIS — Z3A01 Less than 8 weeks gestation of pregnancy: Secondary | ICD-10-CM | POA: Diagnosis not present

## 2024-10-25 DIAGNOSIS — Z3480 Encounter for supervision of other normal pregnancy, unspecified trimester: Secondary | ICD-10-CM | POA: Insufficient documentation

## 2024-10-25 DIAGNOSIS — Z3689 Encounter for other specified antenatal screening: Secondary | ICD-10-CM

## 2024-10-25 NOTE — Progress Notes (Signed)
 New OB Intake  I connected with  Amanda Summers on 10/25/2024 at 11:35 AM EST by MyChart Video Visit and verified that I am speaking with the correct person using two identifiers. Nurse is located at [facility] and pt is located at in office.  I discussed the limitations, risks, security and privacy concerns of performing an evaluation and management service by telephone and the availability of in person appointments. I also discussed with the patient that there may be a patient responsible charge related to this service. The patient expressed understanding and agreed to proceed.  I explained I am completing New OB Intake today. We discussed her EDD of 06/13/2025 that is based on ultrasound on 10/25/24. Pt is G3/P0. I reviewed her allergies, medications, Medical/Surgical/OB history, and appropriate screenings. There are cats in the home: no. Based on history, this is a/an pregnancy uncomplicated . Her obstetrical history is significant for none.  Patient Active Problem List   Diagnosis Date Noted   Supervision of other normal pregnancy, antepartum 10/25/2024   Anemia 10/30/2023   Recurrent major depressive disorder 10/30/2023   Attention deficit hyperactivity disorder (ADHD) 10/30/2023   Ruptured right tubal ectopic pregnancy causing hemoperitoneum 05/10/2022   Obesity, morbid (HCC) 04/08/2021    Concerns addressed today:   Delivery Plans:  Plans to deliver at [facility].  Anatomy US  Explained first scheduled US  will be 10/25/24. Anatomy US  will be scheduled around [redacted] weeks gestational age.  Labs Discussed genetic screening with patient. Patient will be genetic testing to be drawn at new OB visit. Discussed possible labs to be drawn at new OB appointment.  COVID Vaccine Patient has not had COVID vaccine.   Social Determinants of Health Food Insecurity: denies food insecurity WIC Referral: Patient is interested in referral to [facility]  Transportation: Patient denies  transportation needs. Childcare: Discussed no children allowed at ultrasound appointments.   First visit review I reviewed new OB appt with pt. I explained she will have blood work and pap smear/pelvic exam if indicated. Explained pt will be seen by Jinnie Cookey at first visit; encounter routed to appropriate provider.   Rollo JINNY Maxin, CMA 10/25/2024  12:00 PM

## 2024-10-25 NOTE — Patient Instructions (Signed)
 Commonly Asked Questions During Pregnancy  Cats: A parasite can be excreted in cat feces.  To avoid exposure you need to have another person empty the little box.  If you must empty the litter box you will need to wear gloves.  Wash your hands after handling your cat.  This parasite can also be found in raw or undercooked meat so this should also be avoided.  Colds, Sore Throats, Flu: Please check your medication sheet to see what you can take for symptoms.  If your symptoms are unrelieved by these medications please call the office.  Dental Work: Most any dental work agricultural consultant recommends is permitted.  X-rays should only be taken during the first trimester if absolutely necessary.  Your abdomen should be shielded with a lead apron during all x-rays.  Please notify your provider prior to receiving any x-rays.  Novocaine is fine; gas is not recommended.  If your dentist requires a note from us  prior to dental work please call the office and we will provide one for you.  Exercise: Exercise is an important part of staying healthy during your pregnancy.  You may continue most exercises you were accustomed to prior to pregnancy.  Later in your pregnancy you will most likely notice you have difficulty with activities requiring balance like riding a bicycle.  It is important that you listen to your body and avoid activities that put you at a higher risk of falling.  Adequate rest and staying well hydrated are a must!  If you have questions about the safety of specific activities ask your provider.    Exposure to Children with illness: Try to avoid obvious exposure; report any symptoms to us  when noted,  If you have chicken pos, red measles or mumps, you should be immune to these diseases.   Please do not take any vaccines while pregnant unless you have checked with your OB provider.  Fetal Movement: After 28 weeks we recommend you do kick counts twice daily.  Lie or sit down in a calm quiet environment and  count your baby movements kicks.  You should feel your baby at least 10 times per hour.  If you have not felt 10 kicks within the first hour get up, walk around and have something sweet to eat or drink then repeat for an additional hour.  If count remains less than 10 per hour notify your provider.  Fumigating: Follow your pest control agent's advice as to how long to stay out of your home.  Ventilate the area well before re-entering.  Hemorrhoids:   Most over-the-counter preparations can be used during pregnancy.  Check your medication to see what is safe to use.  It is important to use a stool softener or fiber in your diet and to drink lots of liquids.  If hemorrhoids seem to be getting worse please call the office.   Hot Tubs:  Hot tubs Jacuzzis and saunas are not recommended while pregnant.  These increase your internal body temperature and should be avoided.  Intercourse:  Sexual intercourse is safe during pregnancy as long as you are comfortable, unless otherwise advised by your provider.  Spotting may occur after intercourse; report any bright red bleeding that is heavier than spotting.  Labor:  If you know that you are in labor, please go to the hospital.  If you are unsure, please call the office and let us  help you decide what to do.  Lifting, straining, etc:  If your job requires heavy  lifting or straining please check with your provider for any limitations.  Generally, you should not lift items heavier than that you can lift simply with your hands and arms (no back muscles)  Painting:  Paint fumes do not harm your pregnancy, but may make you ill and should be avoided if possible.  Latex or water based paints have less odor than oils.  Use adequate ventilation while painting.  Permanents & Hair Color:  Chemicals in hair dyes are not recommended as they cause increase hair dryness which can increase hair loss during pregnancy.   Highlighting and permanents are allowed.  Dye may be  absorbed differently and permanents may not hold as well during pregnancy.  Sunbathing:  Use a sunscreen, as skin burns easily during pregnancy.  Drink plenty of fluids; avoid over heating.  Tanning Beds:  Because their possible side effects are still unknown, tanning beds are not recommended.  Ultrasound Scans:  Routine ultrasounds are performed at approximately 20 weeks.  You will be able to see your baby's general anatomy an if you would like to know the gender this can usually be determined as well.  If it is questionable when you conceived you may also receive an ultrasound early in your pregnancy for dating purposes.  Otherwise ultrasound exams are not routinely performed unless there is a medical necessity.  Although you can request a scan we ask that you pay for it when conducted because insurance does not cover  patient request scans.  Work: If your pregnancy proceeds without complications you may work until your due date, unless your physician or employer advises otherwise.  Round Ligament Pain/Pelvic Discomfort:  Sharp, shooting pains not associated with bleeding are fairly common, usually occurring in the second trimester of pregnancy.  They tend to be worse when standing up or when you remain standing for long periods of time.  These are the result of pressure of certain pelvic ligaments called round ligaments.  Rest, Tylenol  and heat seem to be the most effective relief.  As the womb and fetus grow, they rise out of the pelvis and the discomfort improves.  Please notify the office if your pain seems different than that described.  It may represent a more serious condition.   First Trimester of Pregnancy  The first trimester of pregnancy starts on the first day of your last monthly period until the end of week 13. This is months 1 through 3 of pregnancy. A week after a sperm fertilizes an egg, the egg will implant into the wall of the uterus and begin to develop into a baby. Body  changes during your first trimester Your body goes through many changes during pregnancy. The changes usually return to normal after your baby is born. Physical changes Your breasts may grow larger and may hurt. The area around your nipples may get darker. Your periods will stop. Your hair and nails may grow faster. You may pee more often. Health changes You may tire easily. Your gums may bleed and may be sensitive when you brush and floss. You may not feel hungry. You may have heartburn. You may throw up or feel like you may throw up. You may want to eat some foods, but not others. You may have headaches. You may have trouble pooping (constipation). Other changes Your emotions may change from day to day. You may have more dreams. Follow these instructions at home: Medicines Talk to your health care provider if you're taking medicines. Ask if the medicines  are safe to take during pregnancy. Your provider may change the medicines that you take. Do not take any medicines unless told to by your provider. Take a prenatal vitamin that has at least 600 micrograms (mcg) of folic acid. Do not use herbal medicines, illegal substances, or medicines that are not approved by your provider. Eating and drinking While you're pregnant your body needs extra food for your growing baby. Talk with your provider about what to eat while pregnant. Activity Most women are able to exercise during pregnancy. Exercises may need to change as your pregnancy goes on. Talk to your provider about your activities and exercise routines. Relieving pain and discomfort Wear a good, supportive bra if your breasts hurt. Rest with your legs raised if you have leg cramps or low back pain. Safety Wear your seatbelt at all times when you're in a car. Talk to your provider if someone hits you, hurts you, or yells at you. Talk with your provider if you're feeling sad or have thoughts of hurting yourself. Lifestyle Certain  things can be harmful while you're pregnant. Follow these rules: Do not use hot tubs, steam rooms, or saunas. Do not douche. Do not use tampons or scented pads. Do not drink alcohol,smoke, vape, or use products with nicotine or tobacco in them. If you need help quitting, talk with your provider. Avoid cat litter boxes and soil used by cats. These things carry germs that can cause harm to your pregnancy and your baby. General instructions Keep all follow-up visits. It helps you and your unborn baby stay as healthy as possible. Write down your questions. Take them to your visits. Your provider will: Talk with you about your overall health. Give you advice or refer you to specialists who can help with different needs, including: Prenatal education classes. Mental health and counseling. Foods and healthy eating. Ask for help if you need help with food. Call your dentist and ask to be seen. Brush your teeth with a soft toothbrush. Floss gently. Where to find more information American Pregnancy Association: americanpregnancy.org [employer]: acog.org Office on [employer] Health: [place] Contact a health care provider if: You feel dizzy, faint, or have a fever. You vomit or have watery poop (diarrhea) for 2 days or more. You have abnormal discharge or bleeding from your vagina. You have pain when you pee or your pee smells bad. You have cramps, pain, or pressure in your belly area. Get help right away if: You have trouble breathing or chest pain. You have any kind of injury, such as from a fall or a car crash. These symptoms may be an emergency. Get help right away. Call 911. Do not wait to see if the symptoms will go away. Do not drive yourself to the hospital. This information is not intended to replace advice given to you by your health care provider. Make sure you discuss any questions you have with your health care provider. Document Revised:  07/27/2023 Document Reviewed: 02/24/2023 Elsevier Patient Education  2024 [employer]  Common Medications Safe in Pregnancy  Acne:      Constipation:  Benzoyl Peroxide     Colace  Clindamycin      Dulcolax Suppository  Topica Erythromycin     Fibercon  Salicylic Acid      Metamucil         Miralax AVOID:        Senakot   Accutane    Cough:  Retin-A  Cough Drops  Tetracycline      Phenergan  w/ Codeine if Rx  Minocycline      Robitussin (Plain & DM)  Antibiotics:     Crabs/Lice:  Ceclor       RID  Cephalosporins    AVOID:  E-Mycins      Kwell  Keflex  Macrobid/Macrodantin   Diarrhea:  Penicillin      Kao-Pectate  Zithromax      Imodium AD         PUSH FLUIDS AVOID:       Cipro     Fever:  Tetracycline      Tylenol  (Regular or Extra  Minocycline       Strength)  Levaquin      Extra Strength-Do not          Exceed 8 tabs/24 hrs Caffeine:        200mg /day (equiv. To 1 cup of coffee or  approx. 3 12 oz sodas)         Gas: Cold/Hayfever:       Gas-X  Benadryl      Mylicon  Claritin       Phazyme  **Claritin-D        Chlor-Trimeton    Headaches:  Dimetapp      ASA-Free Excedrin  Drixoral-Non-Drowsy     Cold Compress  Mucinex (Guaifenasin)     Tylenol  (Regular or Extra  Sudafed/Sudafed-12 Hour     Strength)  **Sudafed PE Pseudoephedrine   Tylenol  Cold & Sinus     Vicks Vapor Rub  Zyrtec  **AVOID if Problems With Blood Pressure         Heartburn: Avoid lying down for at least 1 hour after meals  Aciphex      Maalox     Rash:  Milk of Magnesia     Benadryl    Mylanta       1% Hydrocortisone Cream  Pepcid  Pepcid Complete   Sleep Aids:  Prevacid      Ambien   Prilosec       Benadryl  Rolaids       Chamomile Tea  Tums (Limit 4/day)     Unisom         Tylenol  PM         Warm milk-add vanilla or  Hemorrhoids:       Sugar for taste  Anusol/Anusol H.C.  (RX: Analapram 2.5%)  Sugar Substitutes:  Hydrocortisone OTC     Ok in moderation  Preparation  H      Tucks        Vaseline lotion applied to tissue with wiping    Herpes:     Throat:  Acyclovir      Oragel  Famvir  Valtrex     Vaccines:         Flu Shot Leg Cramps:       *Gardasil  Benadryl      Hepatitis A         Hepatitis B Nasal Spray:       Pneumovax  Saline Nasal Spray     Polio Booster         Tetanus Nausea:       Tuberculosis test or PPD  Vitamin B6 25 mg TID   AVOID:    Dramamine      *Gardasil  Emetrol       Live Poliovirus  Ginger Root 250 mg QID    MMR (measles, mumps &  High Complex Carbs @ Bedtime    rebella)  Sea Bands-Accupressure    Varicella (Chickenpox)  Unisom 1/2 tab TID     *No known complications           If received before Pain:         Known pregnancy;   Darvocet       Resume series after  Lortab        Delivery  Percocet    Yeast:   Tramadol      Femstat  Tylenol  3      Gyne-lotrimin  Ultram       Monistat  Vicodin           MISC:         All Sunscreens           Hair Coloring/highlights          Insect Repellant's          (Including DEET)         Mystic Tans  Tests and Screening During Pregnancy Tests and screenings during pregnancy are an important part of your prenatal care. These tests help your health care provider find any problems that might affect your pregnancy. Some tests need to be done for all pregnant people, and some are optional. Most of the tests and screenings do not pose any risks for you or your baby. You may need more testing if a test result shows there is a risk to your health or your baby's health. Tests and screenings done early in pregnancy Some tests and screenings you may have in early pregnancy are: Blood tests, such as: Complete blood count (CBC). Blood typing. Tests to check for diseases that can cause birth defects or can be passed to your baby, such as: German measles (rubella( and chicken pox. Hepatitis B and C. Human Immunodeficiency Virus (HIV). Syphilis. Zika virus. Pee tests. Blood  pressure. Testing for sexually transmitted infections (STIs), such as chlamydia or gonorrhea. Testing for tuberculosis. Ultrasound. Tests and screenings done later in pregnancy Some common tests you can expect to have later in pregnancy include: Rh antibody testing. Pee and blood tests. Glucose screening. This checks your blood sugar. It will show whether you are developing the type of diabetes that happens during pregnancy, called gestational diabetes. You may have this screening earlier if you have risk factors for diabetes. Ultrasound. This may be repeated at 16-20 weeks to check how your baby is growing. Screening for group B streptococcus (GBS). GBS is a type of bacteria that may live in your rectum or vagina. GBS can spread to your baby during birth. This test is done at 35-37 weeks of pregnancy. Non-stress test. This may be done more often if your pregnancy is high risk. Biophysical profile. This test includes ultrasound imaging and a non-stress test to check to see if your baby is healthy. This test may help decide when your baby should be born. Screening for birth defects Early in your pregnancy, tests can be done to find out if your baby is at risk for a genetic disorder. This testing is optional. The type of testing recommended for you will depend on your family and medical history, your ethnicity, and your age. Testing may include: Screening tests such as ultrasound, blood tests, or a combination of both. Carrier screening. If genetic screening shows that your baby is at risk for a genetic defect, diagnostic testing may be recommended, such as: Amniocentesis. Chorionic villus sampling. Unlike other tests done  during pregnancy, diagnostic testing does have some risk for your pregnancy. Talk to your provider about the risks and benefits of genetic testing. Questions to ask your health care provider What tests are recommended for me? When and how will these tests be done? When will I  get the results of the tests? What do the results of these tests mean for me or my baby? Do you recommend any genetic screening tests? Which ones? Should I see a genetic counselor before having genetic screening? Where to find more information Go to americanpregnancy.org Click on search. Type 'prenatal tests in the search box. Go to [place] Click on search. Type 'prenatal tests in the search box. Go to acog.org Click on search. Type routine tests in the search box. This information is not intended to replace advice given to you by your health care provider. Make sure you discuss any questions you have with your health care provider. Document Revised: 08/22/2023 Document Reviewed: 08/22/2023 Elsevier Patient Education  2025 [employer].

## 2024-10-26 ENCOUNTER — Inpatient Hospital Stay (HOSPITAL_COMMUNITY)
Admission: AD | Admit: 2024-10-26 | Discharge: 2024-10-27 | Disposition: A | Discharge status: Home / Self Care | Attending: Obstetrics and Gynecology | Admitting: Obstetrics and Gynecology

## 2024-10-26 ENCOUNTER — Encounter (HOSPITAL_COMMUNITY): Payer: Self-pay | Admitting: Obstetrics and Gynecology

## 2024-10-26 DIAGNOSIS — O219 Vomiting of pregnancy, unspecified: Secondary | ICD-10-CM | POA: Diagnosis present

## 2024-10-26 DIAGNOSIS — Z3A01 Less than 8 weeks gestation of pregnancy: Secondary | ICD-10-CM | POA: Diagnosis not present

## 2024-10-26 LAB — URINALYSIS, ROUTINE W REFLEX MICROSCOPIC
Bilirubin Urine: NEGATIVE
Glucose, UA: NEGATIVE mg/dL
Hgb urine dipstick: NEGATIVE
Ketones, ur: NEGATIVE mg/dL
Leukocytes,Ua: NEGATIVE
Nitrite: NEGATIVE
Protein, ur: NEGATIVE mg/dL
Specific Gravity, Urine: 1.015 (ref 1.005–1.030)
pH: 7 (ref 5.0–8.0)

## 2024-10-26 NOTE — [facility] Note (Signed)
..  Amanda Summers is a 28 y.o. at [redacted]w[redacted]d here in [facility] reporting: nausea and vomiting for 3 weeks. Was given unisom B6, has taken them but they have not helped with nausea. Has had 5 episodes of emesis today.  Denies pain or vaginal bleeding.  Pain score: 0/10 Vitals:   10/26/24 2228  BP: 128/74  Pulse: 93  Resp: 18  Temp: 99.1 F (37.3 C)  SpO2: 100%     FHT:n/a Lab orders placed from triage:  [facility]

## 2024-10-27 DIAGNOSIS — O219 Vomiting of pregnancy, unspecified: Secondary | ICD-10-CM

## 2024-10-27 DIAGNOSIS — Z3A01 Less than 8 weeks gestation of pregnancy: Secondary | ICD-10-CM | POA: Diagnosis not present

## 2024-10-27 MED ORDER — ONDANSETRON 4 MG PO TBDP
8.0000 mg | ORAL_TABLET | Freq: Once | ORAL | Status: AC
  Administered 2024-10-27: 8 mg via ORAL
  Filled 2024-10-27: qty 2

## 2024-10-27 MED ORDER — ONDANSETRON 8 MG PO TBDP: 8.0000 mg | ORAL_TABLET | Freq: Three times a day (TID) | ORAL | 2 refills | Status: DC | PRN

## 2024-10-27 NOTE — MAU Provider Note (Signed)
 Chief Complaint: Nausea and Emesis   Event Date/Time   First Provider Initiated Contact with Patient 10/27/24 0226      SUBJECTIVE HPI: Amanda Summers is a 28 y.o. G3P0010 at [redacted]w[redacted]d who presents to [facility] Admissions reporting nausea and vomiting for 3 weeks. Was given unisom B6, has taken them but they have not helped with nausea. Has had 5 episodes of emesis today.  Denies pain or vaginal bleeding.  Pain score: 0/10  Associated signs and symptoms: Neg for fever chills, abd pain vaginal bleeding.  Past Medical History:  Diagnosis Date   ADHD    Anxiety    Depression    Ectopic pregnancy    2023   GERD (gastroesophageal reflux disease)    No known health problems    OB History  Gravida Para Term Preterm AB Living  3    1   SAB IAB Ectopic Multiple Live Births    1      # Outcome Date GA Lbr Len/2nd Weight Sex Type Anes PTL Lv  3 Current           2 Ectopic 04/24/22          1 Gravida            Past Surgical History:  Procedure Laterality Date   DIAGNOSTIC LAPAROSCOPY WITH REMOVAL OF ECTOPIC PREGNANCY Right 05/10/2022   Procedure: DIAGNOSTIC LAPAROSCOPY WITH REMOVAL OF ECTOPIC PREGNANCY;  Surgeon: Leonce Garnette BIRCH, MD;  Location: [facility] ORS;  Service: Gynecology;  Laterality: Right;   FOOT SURGERY     LAPAROSCOPIC UNILATERAL SALPINGECTOMY Right 05/10/2022   Procedure: LAPAROSCOPIC UNILATERAL SALPINGECTOMY;  Surgeon: Leonce Garnette BIRCH, MD;  Location: [facility] OR[facility];  Service: Gynecology;  Laterality: Right;   Social History   Socioeconomic History   Marital status: Divorced    Spouse name: Not on file   Number of children: Not on file   Years of education: Not on file   Highest education level: Associate degree: academic program  Occupational History   Not on file  Tobacco Use   Smoking status: Never    Passive exposure: Never   Smokeless tobacco: Never  Vaping Use   Vaping status: Never Used  Substance and Sexual Activity   Alcohol use: Not Currently    Comment:  occasinal   Drug use: Never   Sexual activity: Yes    Birth control/protection: I.U.D.  Other Topics Concern   Not on file  Social History Narrative   Works as a community education officer at [employer]   Tobacco Use: Low Risk (10/26/2024)   Patient History    Smoking Tobacco Use: Never    Smokeless Tobacco Use: Never    Passive Exposure: Never  Financial Resource Strain: Low Risk (10/25/2024)   Overall Financial Resource Strain (CARDIA)    Difficulty of Paying Living Expenses: Not hard at all  Food Insecurity: No Food Insecurity (10/25/2024)   Epic    Worried About Programme Researcher, Broadcasting/film/video in the Last Year: Never true    Ran Out of Food in the Last Year: Never true  Recent Concern: Food Insecurity - Food Insecurity Present (08/20/2024)   Epic    Worried About Programme Researcher, Broadcasting/film/video in the Last Year: Sometimes true    [employer] of Food in the Last Year: Never true  Transportation Needs: No Transportation Needs (10/25/2024)   Epic    Lack of Transportation (Medical): No    Lack of Transportation (Non-Medical): No  Physical Activity: Inactive (10/25/2024)   Exercise Vital Sign    Days of Exercise per Week: 0 days    Minutes of Exercise per Session: 0 min  Stress: No Stress Concern Present (10/25/2024)   [employer] of Occupational Health - Occupational Stress Questionnaire    Feeling of Stress: Not at all  Recent Concern: Stress - Stress Concern Present (08/20/2024)   [employer] of Occupational Health - Occupational Stress Questionnaire    Feeling of Stress: Very much  Social Connections: Socially Isolated (10/25/2024)   Social Connection and Isolation Panel    Frequency of Communication with Friends and Family: More than three times a week    Frequency of Social Gatherings with Friends and Family: Once a week    Attends Religious Services: Never    Database Administrator or Organizations: No    Attends Banker Meetings: Never    Marital Status:  Divorced  Catering Manager Violence: Not At Risk (10/25/2024)   Epic    Fear of Current or Ex-Partner: No    Emotionally Abused: No    Physically Abused: No    Sexually Abused: No  Depression (PHQ2-9): Low Risk (10/25/2024)   Depression (PHQ2-9)    PHQ-2 Score: 0  Alcohol Screen: Low Risk (10/25/2024)   Alcohol Screen    Last Alcohol Screening Score (AUDIT): 0  Housing: Low Risk (10/25/2024)   Epic    Unable to Pay for Housing in the Last Year: No    Number of Times Moved in the Last Year: 0    Homeless in the Last Year: No  Utilities: Not At Risk (10/25/2024)   Epic    Threatened with loss of utilities: No  Health Literacy: Adequate Health Literacy (10/25/2024)   B1300 Health Literacy    Frequency of need for help with medical instructions: Never   Family History  Problem Relation Age of Onset   Cancer Neg Hx    Breast cancer Neg Hx    Ovarian cancer Neg Hx    Medications Ordered Prior to Encounter[1] Allergies[2]  I have reviewed patient's Past Medical Hx, Surgical Hx, Family Hx, Social Hx, medications and allergies.   Review of Systems  Constitutional:  Negative for chills and fever.  Gastrointestinal:  Positive for nausea and vomiting. Negative for abdominal pain and diarrhea.       Denies epigastric pain  Genitourinary:  Positive for decreased urine volume. Negative for dysuria, flank pain, frequency, hematuria and urgency.  Neurological:  Negative for headaches.    OBJECTIVE Patient Vitals for the past 24 hrs:  BP Temp Temp src Pulse Resp SpO2 Height Weight  10/26/24 2228 128/74 99.1 F (37.3 C) Oral 93 18 100 % 5' 7 (1.702 m) 118.8 kg   Constitutional: Well-developed, well-nourished female in no acute distress.  Cardiovascular: normal rate Respiratory: normal rate and effort.  GI: Deferred Neurologic: Alert and oriented x 4.  GU: Deferred  LAB RESULTS Results for orders placed or performed during the hospital encounter of 10/26/24 (from the past 24  hours)  Urinalysis, Routine w reflex microscopic -Urine, Clean Catch     Status: None   Collection Time: 10/26/24 10:34 PM  Result Value Ref Range   Color, Urine YELLOW YELLOW   APPearance CLEAR CLEAR   Specific Gravity, Urine 1.015 1.005 - 1.030   pH 7.0 5.0 - 8.0   Glucose, UA NEGATIVE NEGATIVE mg/dL   Hgb urine dipstick NEGATIVE NEGATIVE   Bilirubin Urine NEGATIVE NEGATIVE   Ketones,  ur NEGATIVE NEGATIVE mg/dL   Protein, ur NEGATIVE NEGATIVE mg/dL   Nitrite NEGATIVE NEGATIVE   Leukocytes,Ua NEGATIVE NEGATIVE    IMAGING N/A  MAU COURSE Orders Placed This Encounter  Procedures   Urinalysis, Routine w reflex microscopic -Urine, Clean Catch   Discharge patient   Meds ordered this encounter  Medications   ondansetron  (ZOFRAN -ODT) disintegrating tablet 8 mg   ondansetron  (ZOFRAN -ODT) 8 MG disintegrating tablet    Sig: Take 1 tablet (8 mg total) by mouth every 8 (eight) hours as needed for nausea or vomiting.    Dispense:  20 tablet    Refill:  2    Supervising Provider:   DUNN, KATHLEEN T [[id number]]    MDM - Nausea and vomiting of pregnancy that did not improve with B6 and Unisom but have resolved with Zofran  ODT.  Tolerating p.o.'s.  No evidence of emergent condition. Rx Zofran .   ASSESSMENT 1. Nausea and vomiting during pregnancy   2. [redacted] weeks gestation of pregnancy     PLAN Discharge home in stable condition. HEG precautions  Follow-up Information     [facility] Follow up.   Specialty: Obstetrics and Gynecology Why: As scheduled or sooner as needed if symptoms worsen Contact information: [address] [facility]  [phone]        [facility] Follow up.   Specialty: Obstetrics and Gynecology Why: As needed in emergencies, As needed if symptoms worsen Contact information: [address] [place]  [phone]               Allergies as  of 10/27/2024   No Known Allergies      Medication List     STOP taking these medications    buPROPion  150 MG 24 hr tablet Commonly known as: Wellbutrin  XL   Zepbound  5 MG/0.5ML Pen Generic drug: tirzepatide        TAKE these medications    multivitamin-prenatal 27-0.8 MG Tabs tablet Take 1 tablet by mouth daily at 12 noon.   ondansetron  8 MG disintegrating tablet Commonly known as: ZOFRAN -ODT Take 1 tablet (8 mg total) by mouth every 8 (eight) hours as needed for nausea or vomiting.         Amanda Summers, Ercie Eliasen , CNM 10/27/2024  2:28 AM       [1]  No current facility-administered medications on file prior to encounter.   Current Outpatient Medications on File Prior to Encounter  Medication Sig Dispense Refill   Prenatal Vit-Fe Fumarate-FA (MULTIVITAMIN-PRENATAL) 27-0.8 MG TABS tablet Take 1 tablet by mouth daily at 12 noon.     buPROPion  (WELLBUTRIN  XL) 150 MG 24 hr tablet Take 1 tablet (150 mg total) by mouth daily. 90 tablet 1   tirzepatide  (ZEPBOUND ) 5 MG/0.5ML Pen Inject 5 mg into the skin once a week. 6 mL 0  [2] No Known Allergies

## 2024-11-01 ENCOUNTER — Ambulatory Visit: Admitting: Obstetrics

## 2024-11-09 DIAGNOSIS — O219 Vomiting of pregnancy, unspecified: Secondary | ICD-10-CM | POA: Diagnosis present

## 2024-11-09 DIAGNOSIS — Z3A09 9 weeks gestation of pregnancy: Secondary | ICD-10-CM | POA: Insufficient documentation

## 2024-11-09 DIAGNOSIS — O210 Mild hyperemesis gravidarum: Secondary | ICD-10-CM | POA: Insufficient documentation

## 2024-11-09 NOTE — ED Triage Notes (Addendum)
 Pt. In via POV from home for N/V, patient is [redacted] weeks pregnant, endorses 5 episodes of emesis in last 24 hours, last episode 1 hour ago, last dose of zofran  3 pm today

## 2024-11-10 ENCOUNTER — Emergency Department
Admission: EM | Admit: 2024-11-10 | Discharge: 2024-11-10 | Disposition: A | Attending: Emergency Medicine | Admitting: Emergency Medicine

## 2024-11-10 DIAGNOSIS — O210 Mild hyperemesis gravidarum: Secondary | ICD-10-CM

## 2024-11-10 LAB — URINALYSIS, ROUTINE W REFLEX MICROSCOPIC
Bacteria, UA: NONE SEEN
Bilirubin Urine: NEGATIVE
Glucose, UA: NEGATIVE mg/dL
Hgb urine dipstick: NEGATIVE
Ketones, ur: 5 mg/dL — AB
Leukocytes,Ua: NEGATIVE
Nitrite: NEGATIVE
Protein, ur: 30 mg/dL — AB
Specific Gravity, Urine: 1.03 (ref 1.005–1.030)
pH: 6 (ref 5.0–8.0)

## 2024-11-10 LAB — HEPATIC FUNCTION PANEL
ALT: 32 U/L (ref 0–44)
AST: 31 U/L (ref 15–41)
Albumin: 4 g/dL (ref 3.5–5.0)
Alkaline Phosphatase: 73 U/L (ref 38–126)
Bilirubin, Direct: 0.2 mg/dL (ref 0.0–0.2)
Indirect Bilirubin: 0.2 mg/dL — ABNORMAL LOW (ref 0.3–0.9)
Total Bilirubin: 0.4 mg/dL (ref 0.0–1.2)
Total Protein: 7.7 g/dL (ref 6.5–8.1)

## 2024-11-10 LAB — BASIC METABOLIC PANEL WITH GFR
Anion gap: 12 (ref 5–15)
BUN: 8 mg/dL (ref 6–20)
CO2: 25 mmol/L (ref 22–32)
Calcium: 9.2 mg/dL (ref 8.9–10.3)
Chloride: 97 mmol/L — ABNORMAL LOW (ref 98–111)
Creatinine, Ser: 0.71 mg/dL (ref 0.44–1.00)
GFR, Estimated: >60 mL/min
Glucose, Bld: 115 mg/dL — ABNORMAL HIGH (ref 70–99)
Potassium: 3.9 mmol/L (ref 3.5–5.1)
Sodium: 133 mmol/L — ABNORMAL LOW (ref 135–145)

## 2024-11-10 LAB — CBC WITH DIFFERENTIAL/PLATELET
Abs Immature Granulocytes: 0.01 K/uL (ref 0.00–0.07)
Basophils Absolute: 0 K/uL (ref 0.0–0.1)
Basophils Relative: 0 %
Eosinophils Absolute: 0 K/uL (ref 0.0–0.5)
Eosinophils Relative: 1 %
HCT: 35.2 % — ABNORMAL LOW (ref 36.0–46.0)
Hemoglobin: 11.4 g/dL — ABNORMAL LOW (ref 12.0–15.0)
Immature Granulocytes: 0 %
Lymphocytes Relative: 33 %
Lymphs Abs: 2.2 K/uL (ref 0.7–4.0)
MCH: 24.8 pg — ABNORMAL LOW (ref 26.0–34.0)
MCHC: 32.4 g/dL (ref 30.0–36.0)
MCV: 76.5 fL — ABNORMAL LOW (ref 80.0–100.0)
Monocytes Absolute: 0.4 K/uL (ref 0.1–1.0)
Monocytes Relative: 6 %
Neutro Abs: 3.9 K/uL (ref 1.7–7.7)
Neutrophils Relative %: 60 %
Platelets: 260 K/uL (ref 150–400)
RBC: 4.6 MIL/uL (ref 3.87–5.11)
RDW: 15.1 % (ref 11.5–15.5)
WBC: 6.6 K/uL (ref 4.0–10.5)
nRBC: 0 % (ref 0.0–0.2)

## 2024-11-10 MED ORDER — METOCLOPRAMIDE HCL 5 MG/ML IJ SOLN
10.0000 mg | Freq: Once | INTRAMUSCULAR | Status: AC
  Administered 2024-11-10: 10 mg via INTRAVENOUS
  Filled 2024-11-10: qty 2

## 2024-11-10 MED ORDER — SODIUM CHLORIDE 0.9 % IV BOLUS
1000.0000 mL | Freq: Once | INTRAVENOUS | Status: AC
  Administered 2024-11-10: 1000 mL via INTRAVENOUS

## 2024-11-10 MED ORDER — METOCLOPRAMIDE HCL 10 MG PO TABS: 10.0000 mg | ORAL_TABLET | Freq: Three times a day (TID) | ORAL | 0 refills | Status: DC | PRN

## 2024-11-10 NOTE — ED Provider Notes (Signed)
 "  [facility] Provider Note    Event Date/Time   First MD Initiated Contact with Patient 11/10/24 0136     (approximate)   History   Emesis During Pregnancy   HPI  Amanda Summers is a 29 y.o. female recently seen at [facility] who comes in with concerns for nausea and vomiting during pregnancy.  Patient had a ultrasound done on 12/21 showing a 7-week viable single intrauterine pregnancy.  She reports currently being about [redacted] weeks pregnant and having 5 episodes of nausea and vomiting the past 24 hours.  Did take Zofran .  Patient reports that this feels similar to when she has had during this pregnancy.  She denies any other symptoms to suggest the flu.  She did go to [facility] which I reviewed the note from 10/27/2024 where this has been an ongoing issue and did not improve with B6 and Unisom so was prescribed Zofran .  Patient denies any abdominal pain she denies any gush of fluids or vaginal bleeding.     Physical Exam   Triage Vital Signs: ED Triage Vitals  Encounter Vitals Group     BP 11/09/24 2225 110/66     Girls Systolic BP Percentile --      Girls Diastolic BP Percentile --      Boys Systolic BP Percentile --      Boys Diastolic BP Percentile --      Pulse Rate 11/09/24 2225 91     Resp 11/09/24 2225 18     Temp 11/09/24 2225 99.3 F (37.4 C)     Temp Source 11/09/24 2225 Oral     SpO2 11/09/24 2225 100 %     Weight 11/09/24 2225 261 lb 14.4 oz (118.8 kg)     Height 11/09/24 2225 5' 7 (1.702 m)     Head Circumference --      Peak Flow --      Pain Score 11/09/24 2241 0     Pain Loc --      Pain Education --      Exclude from Growth Chart --     Most recent vital signs: Vitals:   11/09/24 2225  BP: 110/66  Pulse: 91  Resp: 18  Temp: 99.3 F (37.4 C)  SpO2: 100%     General: Awake, no distress.  CV:  Good peripheral perfusion.  Resp:  Normal effort.  Abd:  No distention.  Soft and nontender Other:     ED Results / Procedures  / Treatments   Labs (all labs ordered are listed, but only abnormal results are displayed) Labs Reviewed  CBC WITH DIFFERENTIAL/PLATELET - Abnormal; Notable for the following components:      Result Value   Hemoglobin 11.4 (*)    HCT 35.2 (*)    MCV 76.5 (*)    MCH 24.8 (*)    All other components within normal limits  BASIC METABOLIC PANEL WITH GFR - Abnormal; Notable for the following components:   Sodium 133 (*)    Chloride 97 (*)    Glucose, Bld 115 (*)    All other components within normal limits       PROCEDURES:  Critical Care performed: No  Procedures   MEDICATIONS ORDERED IN ED: Medications - No data to display   IMPRESSION / MDM / ASSESSMENT AND PLAN / ED COURSE  I reviewed the triage vital signs and the nursing notes.   Patient's presentation is most consistent with acute presentation with potential  threat to life or bodily function.  Patient comes in with nausea vomiting the setting of known pregnancy.  She has had an ultrasound done with confirmation of intrauterine pregnancy and her abdomen is soft and nontender she denies any abdominal pain.  Denies any vaginal bleeding.  Suspect most likely hyperemesis related to pregnancy.  Will check urine to evaluate for UTI check electrolytes.  BMP is reassuring slightly low sodium and chloride.  CBC slightly low hemoglobin  3:04 AM repeat evaluation abdomen soft and nontender.  She reports feeling improved.  Will do p.o. challenge.  3:42 AM Patient tolerated p.o.  Patient feels comfortable with discharge home.  She wants to try the Reglan  at home instead of Zofran .  We discussed following up with her OB/GYN and return precautions and she feels comfortable with this plan and discharged home    FINAL CLINICAL IMPRESSION(S) / ED DIAGNOSES   Final diagnoses:  Hyperemesis gravidarum     Rx / DC Orders   ED Discharge Orders     None        Note:  This document was prepared using Dragon voice  recognition software and may include unintentional dictation errors.   Ernest Ronal BRAVO, MD 11/10/24 [phone]  "

## 2024-11-10 NOTE — ED Notes (Signed)
Pt given ginger ale and saltine crackers.  

## 2024-11-10 NOTE — Discharge Instructions (Signed)
 Your workup was reassuring but I suspect this is related to your pregnancy you should return to the ER if you develop any vaginal bleeding, unable to keep fluids down or any other concerns otherwise please call your OB/GYN to make a follow-up appointment.

## 2024-11-15 ENCOUNTER — Other Ambulatory Visit: Payer: Self-pay

## 2024-11-15 DIAGNOSIS — R112 Nausea with vomiting, unspecified: Secondary | ICD-10-CM

## 2024-11-15 MED ORDER — PROMETHAZINE HCL 25 MG PO TABS: 25.0000 mg | ORAL_TABLET | Freq: Four times a day (QID) | ORAL | 2 refills | Status: DC | PRN

## 2024-11-15 NOTE — Progress Notes (Signed)
 Pt experiencing continued nausea and vomiting.  She had been trying vitamin B6 with Unisom  and then was given ondansetron  without improvement.  She was seen in the ED 11/10/24 and given Reglan .  She says it is no more effective than the ondansetron .  Promethazine  sent to pt pharmacy today.  She is aware that it may make her very sleepy.

## 2024-11-19 ENCOUNTER — Other Ambulatory Visit: Payer: Self-pay

## 2024-11-19 ENCOUNTER — Encounter: Payer: Self-pay | Admitting: Emergency Medicine

## 2024-11-19 ENCOUNTER — Emergency Department
Admission: EM | Admit: 2024-11-19 | Discharge: 2024-11-19 | Disposition: A | Discharge status: Home / Self Care | Source: Home / Self Care | Attending: Emergency Medicine | Admitting: Emergency Medicine

## 2024-11-19 DIAGNOSIS — Z3A10 10 weeks gestation of pregnancy: Secondary | ICD-10-CM | POA: Insufficient documentation

## 2024-11-19 DIAGNOSIS — O210 Mild hyperemesis gravidarum: Secondary | ICD-10-CM | POA: Insufficient documentation

## 2024-11-19 LAB — BASIC METABOLIC PANEL WITH GFR
Anion gap: 16 — ABNORMAL HIGH (ref 5–15)
BUN: 7 mg/dL (ref 6–20)
CO2: 19 mmol/L — ABNORMAL LOW (ref 22–32)
Calcium: 9.7 mg/dL (ref 8.9–10.3)
Chloride: 95 mmol/L — ABNORMAL LOW (ref 98–111)
Creatinine, Ser: 0.64 mg/dL (ref 0.44–1.00)
GFR, Estimated: >60 mL/min
Glucose, Bld: 78 mg/dL (ref 70–99)
Potassium: 3.4 mmol/L — ABNORMAL LOW (ref 3.5–5.1)
Sodium: 130 mmol/L — ABNORMAL LOW (ref 135–145)

## 2024-11-19 LAB — CBC WITH DIFFERENTIAL/PLATELET
Abs Immature Granulocytes: 0.02 K/uL (ref 0.00–0.07)
Basophils Absolute: 0 K/uL (ref 0.0–0.1)
Basophils Relative: 0 %
Eosinophils Absolute: 0 K/uL (ref 0.0–0.5)
Eosinophils Relative: 0 %
HCT: 35 % — ABNORMAL LOW (ref 36.0–46.0)
Hemoglobin: 11.6 g/dL — ABNORMAL LOW (ref 12.0–15.0)
Immature Granulocytes: 0 %
Lymphocytes Relative: 29 %
Lymphs Abs: 1.3 K/uL (ref 0.7–4.0)
MCH: 24.8 pg — ABNORMAL LOW (ref 26.0–34.0)
MCHC: 33.1 g/dL (ref 30.0–36.0)
MCV: 74.9 fL — ABNORMAL LOW (ref 80.0–100.0)
Monocytes Absolute: 0.2 K/uL (ref 0.1–1.0)
Monocytes Relative: 5 %
Neutro Abs: 3 K/uL (ref 1.7–7.7)
Neutrophils Relative %: 66 %
Platelets: 236 K/uL (ref 150–400)
RBC: 4.67 MIL/uL (ref 3.87–5.11)
RDW: 14.6 % (ref 11.5–15.5)
WBC: 4.6 K/uL (ref 4.0–10.5)
nRBC: 0 % (ref 0.0–0.2)

## 2024-11-19 LAB — HEPATIC FUNCTION PANEL
ALT: 25 U/L (ref 0–44)
AST: 24 U/L (ref 15–41)
Albumin: 4.2 g/dL (ref 3.5–5.0)
Alkaline Phosphatase: 77 U/L (ref 38–126)
Bilirubin, Direct: 0.5 mg/dL — ABNORMAL HIGH (ref 0.0–0.2)
Indirect Bilirubin: 0.3 mg/dL (ref 0.3–0.9)
Total Bilirubin: 0.9 mg/dL (ref 0.0–1.2)
Total Protein: 8 g/dL (ref 6.5–8.1)

## 2024-11-19 LAB — URINALYSIS, ROUTINE W REFLEX MICROSCOPIC
Bilirubin Urine: NEGATIVE
Glucose, UA: NEGATIVE mg/dL
Hgb urine dipstick: NEGATIVE
Ketones, ur: 20 mg/dL — AB
Leukocytes,Ua: NEGATIVE
Nitrite: NEGATIVE
Protein, ur: 30 mg/dL — AB
Specific Gravity, Urine: 1.026 (ref 1.005–1.030)
pH: 6 (ref 5.0–8.0)

## 2024-11-19 LAB — HCG, QUANTITATIVE, PREGNANCY: hCG, Beta Chain, Quant, S: 149764 m[IU]/mL — ABNORMAL HIGH

## 2024-11-19 MED ORDER — SODIUM CHLORIDE 0.9 % IV BOLUS
1000.0000 mL | Freq: Once | INTRAVENOUS | Status: AC
  Administered 2024-11-19: 1000 mL via INTRAVENOUS

## 2024-11-19 MED ORDER — METOCLOPRAMIDE HCL 5 MG/ML IJ SOLN
10.0000 mg | Freq: Once | INTRAMUSCULAR | Status: AC
  Administered 2024-11-19: 10 mg via INTRAVENOUS
  Filled 2024-11-19: qty 2

## 2024-11-19 MED ORDER — DEXTROSE 5 % AND 0.9 % NACL IV BOLUS
500.0000 mL | Freq: Once | INTRAVENOUS | Status: AC
  Administered 2024-11-19: 500 mL via INTRAVENOUS
  Filled 2024-11-19: qty 500

## 2024-11-19 NOTE — ED Triage Notes (Signed)
 BIB [facility] from home.  C/O vomiting, feeling week x 2 days.  [redacted] weeks pregnant. G3 P0.  Has zofran  prescribed, but unable to keep it down. 133/77 100 100%

## 2024-11-19 NOTE — ED Provider Notes (Signed)
 "  [facility] Provider Note    Event Date/Time   First MD Initiated Contact with Patient 11/19/24 1825     (approximate)   History   Emesis During Pregnancy   HPI  Amanda Summers is a 29 y.o. female who is about [redacted] weeks pregnant who comes in with concerns for feeling weak and nauseous for the past 2 days.  Patient reports not been able to keep down her medications.  She has had ultrasound that has shown a viable IUP.  I reviewed this from 10/25/2024.  She denies any significant pain.  Denies any vaginal bleeding, vaginal discharge, gush of fluids or any other concerns.   Physical Exam   Triage Vital Signs: ED Triage Vitals  Encounter Vitals Group     BP 11/19/24 1447 115/70     Girls Systolic BP Percentile --      Girls Diastolic BP Percentile --      Boys Systolic BP Percentile --      Boys Diastolic BP Percentile --      Pulse Rate 11/19/24 1447 87     Resp 11/19/24 1452 17     Temp 11/19/24 1447 98.6 F (37 C)     Temp Source 11/19/24 1447 Oral     SpO2 11/19/24 1447 100 %     Weight --      Height --      Head Circumference --      Peak Flow --      Pain Score 11/19/24 1450 0     Pain Loc --      Pain Education --      Exclude from Growth Chart --     Most recent vital signs: Vitals:   11/19/24 1447 11/19/24 1452  BP: 115/70   Pulse: 87   Resp:  17  Temp: 98.6 F (37 C)   SpO2: 100%      General: Awake, no distress.  CV:  Good peripheral perfusion.  Resp:  Normal effort.  Abd:  No distention.  Soft nontender Other:     ED Results / Procedures / Treatments   Labs (all labs ordered are listed, but only abnormal results are displayed) Labs Reviewed  CBC WITH DIFFERENTIAL/PLATELET - Abnormal; Notable for the following components:      Result Value   Hemoglobin 11.6 (*)    HCT 35.0 (*)    MCV 74.9 (*)    MCH 24.8 (*)    All other components within normal limits  BASIC METABOLIC PANEL WITH GFR - Abnormal; Notable for the  following components:   Sodium 130 (*)    Potassium 3.4 (*)    Chloride 95 (*)    CO2 19 (*)    Anion gap 16 (*)    All other components within normal limits  HCG, QUANTITATIVE, PREGNANCY - Abnormal; Notable for the following components:   hCG, Beta Chain, Quant, S [id number] (*)    All other components within normal limits      PROCEDURES:  Critical Care performed: No  Procedures   MEDICATIONS ORDERED IN ED: Medications - No data to display   IMPRESSION / MDM / ASSESSMENT AND PLAN / ED COURSE  I reviewed the triage vital signs and the nursing notes.   Patient's presentation is most consistent with acute presentation with potential threat to life or bodily function.   Patient comes in with nausea vomiting the setting of pregnancy.  This seems most consistent with hyperemesis gravidarum.  Will check electrolytes.  Abdomen exam is reassuring.  No vaginal bleeding.  Patient ordered some Reglan , IV fluids  UA without evidence of UTI but does have ketones in it therefore we will trial some IV fluids with dextrose   CBC is reassuring with stable hemoglobin.  Her BMP shows slightly low sodium and bicarb.  Anion gap slightly elevated but her glucose is normal no evidence of DKA  I repeat abdominal exam she remains soft and nontender she is now tolerating p.o. and is requesting additional juice.  I considered admission for patient given she does appear slightly more dehydrated but given she is tolerating fluids orally it is reasonable to trial outpatient management and have her follow-up with her OB/GYN.  Patient felt comfortable with this plan.     FINAL CLINICAL IMPRESSION(S) / ED DIAGNOSES   Final diagnoses:  Hyperemesis gravidarum     Rx / DC Orders   ED Discharge Orders     None        Note:  This document was prepared using Dragon voice recognition software and may include unintentional dictation errors.   Ernest Ronal BRAVO, MD 11/19/24 2125  "

## 2024-11-19 NOTE — Discharge Instructions (Addendum)
 Please call your OB team and let them know that you have been in the emergency room.  Your workup did show some slightly increased dehydration should continue to have issues at home you may need to return for recheck.  However here you are able to tolerate fluids and it is reasonable to try outpatient management.  Return for fevers, vaginal bleeding, worsening symptoms or other concerns

## 2024-11-20 ENCOUNTER — Telehealth: Payer: Self-pay

## 2024-11-20 ENCOUNTER — Other Ambulatory Visit: Payer: Self-pay

## 2024-11-20 ENCOUNTER — Inpatient Hospital Stay
Admission: EM | Admit: 2024-11-20 | Discharge: 2024-11-27 | DRG: 833 | Disposition: A | Discharge status: Home / Self Care | Source: Ambulatory Visit | Attending: Obstetrics | Admitting: Obstetrics

## 2024-11-20 ENCOUNTER — Emergency Department

## 2024-11-20 ENCOUNTER — Encounter: Payer: Self-pay | Admitting: Emergency Medicine

## 2024-11-20 DIAGNOSIS — R1110 Vomiting, unspecified: Secondary | ICD-10-CM | POA: Diagnosis present

## 2024-11-20 DIAGNOSIS — O219 Vomiting of pregnancy, unspecified: Principal | ICD-10-CM

## 2024-11-20 DIAGNOSIS — Z3A10 10 weeks gestation of pregnancy: Secondary | ICD-10-CM

## 2024-11-20 DIAGNOSIS — O211 Hyperemesis gravidarum with metabolic disturbance: Principal | ICD-10-CM | POA: Diagnosis present

## 2024-11-20 DIAGNOSIS — O99611 Diseases of the digestive system complicating pregnancy, first trimester: Secondary | ICD-10-CM | POA: Diagnosis present

## 2024-11-20 DIAGNOSIS — O210 Mild hyperemesis gravidarum: Secondary | ICD-10-CM | POA: Diagnosis present

## 2024-11-20 DIAGNOSIS — K117 Disturbances of salivary secretion: Secondary | ICD-10-CM | POA: Diagnosis present

## 2024-11-20 LAB — URINALYSIS, ROUTINE W REFLEX MICROSCOPIC
Bacteria, UA: NONE SEEN
Bilirubin Urine: NEGATIVE
Glucose, UA: NEGATIVE mg/dL
Hgb urine dipstick: NEGATIVE
Ketones, ur: 20 mg/dL — AB
Leukocytes,Ua: NEGATIVE
Nitrite: NEGATIVE
Protein, ur: 30 mg/dL — AB
RBC / HPF: 0 RBC/hpf (ref 0–5)
Specific Gravity, Urine: 1.025 (ref 1.005–1.030)
pH: 6 (ref 5.0–8.0)

## 2024-11-20 LAB — HCG, QUANTITATIVE, PREGNANCY: hCG, Beta Chain, Quant, S: 156100 m[IU]/mL — ABNORMAL HIGH

## 2024-11-20 LAB — COMPREHENSIVE METABOLIC PANEL WITH GFR
ALT: 20 U/L (ref 0–44)
AST: 24 U/L (ref 15–41)
Albumin: 4.2 g/dL (ref 3.5–5.0)
Alkaline Phosphatase: 73 U/L (ref 38–126)
Anion gap: 13 (ref 5–15)
BUN: 6 mg/dL (ref 6–20)
CO2: 21 mmol/L — ABNORMAL LOW (ref 22–32)
Calcium: 9.8 mg/dL (ref 8.9–10.3)
Chloride: 98 mmol/L (ref 98–111)
Creatinine, Ser: 0.63 mg/dL (ref 0.44–1.00)
GFR, Estimated: >60 mL/min
Glucose, Bld: 93 mg/dL (ref 70–99)
Potassium: 2.9 mmol/L — ABNORMAL LOW (ref 3.5–5.1)
Sodium: 133 mmol/L — ABNORMAL LOW (ref 135–145)
Total Bilirubin: 0.9 mg/dL (ref 0.0–1.2)
Total Protein: 8.1 g/dL (ref 6.5–8.1)

## 2024-11-20 LAB — CBC
HCT: 34.5 % — ABNORMAL LOW (ref 36.0–46.0)
Hemoglobin: 11.3 g/dL — ABNORMAL LOW (ref 12.0–15.0)
MCH: 25.1 pg — ABNORMAL LOW (ref 26.0–34.0)
MCHC: 32.8 g/dL (ref 30.0–36.0)
MCV: 76.5 fL — ABNORMAL LOW (ref 80.0–100.0)
Platelets: 226 K/uL (ref 150–400)
RBC: 4.51 MIL/uL (ref 3.87–5.11)
RDW: 14.6 % (ref 11.5–15.5)
WBC: 4.5 K/uL (ref 4.0–10.5)
nRBC: 0 % (ref 0.0–0.2)

## 2024-11-20 LAB — LIPASE, BLOOD: Lipase: 22 U/L (ref 11–51)

## 2024-11-20 LAB — BETA-HYDROXYBUTYRIC ACID: Beta-Hydroxybutyric Acid: 2 mmol/L — ABNORMAL HIGH (ref 0.05–0.27)

## 2024-11-20 MED ORDER — PROMETHAZINE HCL 25 MG PO TABS: 12.5000 mg | ORAL_TABLET | ORAL | Status: DC | PRN

## 2024-11-20 MED ORDER — ONDANSETRON HCL 4 MG/2ML IJ SOLN: 4.0000 mg | Freq: Three times a day (TID) | INTRAMUSCULAR | Status: DC | PRN

## 2024-11-20 MED ORDER — SODIUM CHLORIDE 0.9 % IV BOLUS
1000.0000 mL | Freq: Once | INTRAVENOUS | Status: AC
  Administered 2024-11-20: 1000 mL via INTRAVENOUS

## 2024-11-20 MED ORDER — FAMOTIDINE 20 MG PO TABS
20.0000 mg | ORAL_TABLET | Freq: Two times a day (BID) | ORAL | Status: DC
  Administered 2024-11-23 – 2024-11-27 (×7): 20 mg via ORAL
  Filled 2024-11-20 (×9): qty 1

## 2024-11-20 MED ORDER — SODIUM CHLORIDE 0.9 % IV SOLN: 8.0000 mg | Freq: Three times a day (TID) | INTRAVENOUS | Status: DC | PRN

## 2024-11-20 MED ORDER — SODIUM CHLORIDE 0.9 % IV SOLN: INTRAVENOUS | Status: AC

## 2024-11-20 MED ORDER — METOCLOPRAMIDE HCL 10 MG PO TABS
10.0000 mg | ORAL_TABLET | Freq: Four times a day (QID) | ORAL | Status: DC
  Administered 2024-11-21 – 2024-11-27 (×16): 10 mg via ORAL
  Filled 2024-11-20 (×17): qty 1

## 2024-11-20 MED ORDER — HYDROXYZINE HCL 50 MG/ML IM SOLN
50.0000 mg | Freq: Four times a day (QID) | INTRAMUSCULAR | Status: DC | PRN
  Filled 2024-11-20: qty 1

## 2024-11-20 MED ORDER — FAMOTIDINE IN NACL 20-0.9 MG/50ML-% IV SOLN
20.0000 mg | Freq: Two times a day (BID) | INTRAVENOUS | Status: DC
  Administered 2024-11-20 – 2024-11-25 (×7): 20 mg via INTRAVENOUS
  Filled 2024-11-20 (×15): qty 50

## 2024-11-20 MED ORDER — ZOLPIDEM TARTRATE 5 MG PO TABS: 5.0000 mg | ORAL_TABLET | Freq: Every evening | ORAL | Status: DC | PRN

## 2024-11-20 MED ORDER — ACETAMINOPHEN 325 MG PO TABS
650.0000 mg | ORAL_TABLET | ORAL | Status: DC | PRN
  Administered 2024-11-20: 650 mg via ORAL
  Filled 2024-11-20: qty 2

## 2024-11-20 MED ORDER — MORPHINE SULFATE (PF) 2 MG/ML IV SOLN: 2.0000 mg | INTRAVENOUS | Status: DC | PRN

## 2024-11-20 MED ORDER — POTASSIUM CHLORIDE 10 MEQ/100ML IV SOLN
10.0000 meq | Freq: Once | INTRAVENOUS | Status: AC
  Administered 2024-11-20: 10 meq via INTRAVENOUS
  Filled 2024-11-20: qty 100

## 2024-11-20 MED ORDER — POTASSIUM CHLORIDE 10 MEQ/100ML IV SOLN: 10.0000 meq | INTRAVENOUS | Status: DC

## 2024-11-20 MED ORDER — PROMETHAZINE HCL 25 MG RE SUPP
12.5000 mg | RECTAL | Status: DC | PRN
  Filled 2024-11-20: qty 1

## 2024-11-20 MED ORDER — METOCLOPRAMIDE HCL 5 MG/ML IJ SOLN
10.0000 mg | Freq: Four times a day (QID) | INTRAMUSCULAR | Status: DC
  Administered 2024-11-20 – 2024-11-25 (×12): 10 mg via INTRAVENOUS
  Filled 2024-11-20 (×13): qty 2

## 2024-11-20 MED ORDER — DOCUSATE SODIUM 100 MG PO CAPS
100.0000 mg | ORAL_CAPSULE | Freq: Every day | ORAL | Status: DC
  Administered 2024-11-23 – 2024-11-27 (×4): 100 mg via ORAL
  Filled 2024-11-20 (×6): qty 1

## 2024-11-20 MED ORDER — CALCIUM CARBONATE ANTACID 500 MG PO CHEW
2.0000 | CHEWABLE_TABLET | ORAL | Status: DC | PRN
  Administered 2024-11-23: 400 mg via ORAL
  Filled 2024-11-20: qty 2

## 2024-11-20 MED ORDER — LACTATED RINGERS IV SOLN
125.0000 mL/h | INTRAVENOUS | Status: AC
  Administered 2024-11-21: 125 mL/h via INTRAVENOUS

## 2024-11-20 MED ORDER — POTASSIUM CHLORIDE CRYS ER 20 MEQ PO TBCR: 40.0000 meq | EXTENDED_RELEASE_TABLET | ORAL | Status: DC

## 2024-11-20 MED ORDER — ONDANSETRON 4 MG PO TBDP
4.0000 mg | ORAL_TABLET | Freq: Three times a day (TID) | ORAL | Status: DC | PRN
  Filled 2024-11-20: qty 1

## 2024-11-20 MED ORDER — HYDROXYZINE HCL 25 MG PO TABS: 50.0000 mg | ORAL_TABLET | Freq: Four times a day (QID) | ORAL | Status: DC | PRN

## 2024-11-20 MED ORDER — ONDANSETRON HCL 4 MG/2ML IJ SOLN
4.0000 mg | INTRAMUSCULAR | Status: AC
  Administered 2024-11-20: 4 mg via INTRAVENOUS
  Filled 2024-11-20: qty 2

## 2024-11-20 MED ORDER — POTASSIUM CHLORIDE 10 MEQ/100ML IV SOLN
10.0000 meq | INTRAVENOUS | Status: AC
  Administered 2024-11-20 (×4): 10 meq via INTRAVENOUS
  Filled 2024-11-20 (×4): qty 100

## 2024-11-20 NOTE — Telephone Encounter (Signed)
 Patient calling triage. Was seen last night at ED for hyperemesis gravidarum. She was sent home and since then she has vomited about 10 times, both when and when not drinking fluids. Asking if there is something else she can try or should she go back to ED, nausea med she has at home not working. Per Missy, patient needs to go back to ED. Patient aware.

## 2024-11-20 NOTE — ED Provider Notes (Signed)
 "  [facility] Provider Note    None    (approximate)   History   Emesis During Pregnancy   HPI  Amanda Summers is a 29 y.o. female who is currently pregnant. About [redacted]w[redacted]d.  Reviewed external records from/discussed case with Zelda Hummer midwife  Patient was referred to the ER.  They had intended to direct admission but could not be performed due to boarding and capacity on their floor.  Patient referred to the ER and the concern is ongoing concerns of vomiting hyperemesis  Patient reports that she has had several days of vomiting this been an intermittent issue throughout pregnancy she was seen in ER yesterday without improvement continues to have vomiting feeling fatigued not keeping much for fluids at all down cannot eat any solid.  Not bloody emesis but reports she started having like dark shows me a bag with appears to be acidic stomach content in it.  She reports intermittently it will hurt some with the vomiting and a little bit of pain in her upper abdomen at times  No fevers.  She has had no vaginal bleeding or discharge.  She is otherwise felt well but just cannot seem to control the nausea and vomiting.  Denies use of any marijuana or THC like  Past Medical History:  Diagnosis Date   ADHD    Anxiety    Depression    Ectopic pregnancy    2023   GERD (gastroesophageal reflux disease)    No known health problems      Physical Exam   Triage Vital Signs: ED Triage Vitals  Encounter Vitals Group     BP 11/20/24 1215 105/68     Girls Systolic BP Percentile --      Girls Diastolic BP Percentile --      Boys Systolic BP Percentile --      Boys Diastolic BP Percentile --      Pulse Rate 11/20/24 1215 (!) 109     Resp 11/20/24 1215 18     Temp 11/20/24 1215 98.4 F (36.9 C)     Temp Source 11/20/24 1215 Oral     SpO2 11/20/24 1215 98 %     Weight 11/20/24 1217 260 lb 2.3 oz (118 kg)     Height 11/20/24 1217 5' 7 (1.702 m)     Head  Circumference --      Peak Flow --      Pain Score 11/20/24 1217 0     Pain Loc --      Pain Education --      Exclude from Growth Chart --     Most recent vital signs: Vitals:   11/20/24 1215  BP: 105/68  Pulse: (!) 109  Resp: 18  Temp: 98.4 F (36.9 C)  SpO2: 98%     General: Awake, no distress.  She does have active emesis about 100 mL of nonbloody stomach ascitic type content. CV:   Good peripheral perfusion. Normal rate and heart tones. Resp:   Normal effort. Lung sounds clear bilateral. Speaking without distress. Abd:   No distention. Soft, non-tender to palpation in all quadrants except some very mild discomfort in the epigastrium and right upper quadrant without rebound or guarding.  Not a specific Murphy sign. No rebound or guarding.  No pain in the lower abdomen bilateral. Neuro:   No focal neuro deficits noted. Moves extremities well without noted concern. Other:  Patient does report that she has not had a good  bowel movement for couple weeks but is still passing gas without issue.   ED Results / Procedures / Treatments   Labs (all labs ordered are listed, but only abnormal results are displayed) Labs Reviewed  CBC - Abnormal; Notable for the following components:      Result Value   Hemoglobin 11.3 (*)    HCT 34.5 (*)    MCV 76.5 (*)    MCH 25.1 (*)    All other components within normal limits  COMPREHENSIVE METABOLIC PANEL WITH GFR - Abnormal; Notable for the following components:   Sodium 133 (*)    Potassium 2.9 (*)    CO2 21 (*)    All other components within normal limits  BETA-HYDROXYBUTYRIC ACID - Abnormal; Notable for the following components:   Beta-Hydroxybutyric Acid 2.00 (*)    All other components within normal limits  LIPASE, BLOOD  HCG, QUANTITATIVE, PREGNANCY  URINALYSIS, ROUTINE W REFLEX MICROSCOPIC  URINE DRUG SCREEN      RADIOLOGY Right upper quadrant ultrasound negative for acute pathology  Pretest probability is fairly low  for acute cholelithiasis/cholecystitis but given the recurrence and ongoing nature ordered ultrasound to evaluate make certain that the mild but present abdominal discomfort in the epigastrium right upper quadrant is not related to acute hepatobiliary cause.  She is not having findings on exam to suggest acute peritonitis or obstructive finding  PROCEDURES:  Critical Care performed: No  Procedures   MEDICATIONS ORDERED IN ED: Medications  sodium chloride  0.9 % bolus 1,000 mL (has no administration in time range)  ondansetron  (ZOFRAN ) injection 4 mg (has no administration in time range)  lactated ringers  infusion (has no administration in time range)  acetaminophen  (TYLENOL ) tablet 650 mg (has no administration in time range)  docusate sodium  (COLACE) capsule 100 mg (has no administration in time range)  calcium  carbonate (TUMS - dosed in mg elemental calcium ) chewable tablet 400 mg of elemental calcium  (has no administration in time range)  0.9 %  sodium chloride  infusion (has no administration in time range)  zolpidem  (AMBIEN ) tablet 5 mg (has no administration in time range)  metoCLOPramide  (REGLAN ) tablet 10 mg (has no administration in time range)    Or  metoCLOPramide  (REGLAN ) injection 10 mg (has no administration in time range)  ondansetron  (ZOFRAN -ODT) disintegrating tablet 4-8 mg (has no administration in time range)    Or  ondansetron  (ZOFRAN ) injection 4 mg (has no administration in time range)    Or  ondansetron  (ZOFRAN ) 8 mg in sodium chloride  0.9 % 50 mL IVPB (has no administration in time range)  promethazine  (PHENERGAN ) tablet 12.5-25 mg (has no administration in time range)    Or  promethazine  (PHENERGAN ) suppository 12.5-25 mg (has no administration in time range)  hydrOXYzine  (ATARAX ) tablet 50 mg (has no administration in time range)    Or  hydrOXYzine  (VISTARIL ) injection 50 mg (has no administration in time range)  famotidine  (PEPCID ) tablet 20 mg (has no  administration in time range)    Or  famotidine  (PEPCID ) IVPB 20 mg premix (has no administration in time range)     IMPRESSION / MDM / ASSESSMENT AND PLAN / ED COURSE  I reviewed the triage vital signs and the nursing notes.                              Based on presentation, the differential diagnosis includes, but is not limited to key considerations:  Possible dehydration, hyperemesis,  cyclical vomiting, etc.  She has a confirmed intrauterine pregnancy and I think Moler ectopic is extremely unlikely based on the current history.  She has no acute OB type symptoms but does have persistent emesis.  She tried Zofran  she tried Reglan  she still not able to take well at home.  OB sent her for admission I have contacted them and they will see and admit her.  OB team to follow-up on pending evaluation labs and response to treatment.  Patient agreeable with plan for admission  Patient's presentation is most consistent with acute complicated illness / injury requiring diagnostic workup.    The patient is on the cardiac monitor to evaluate for evidence of arrhythmia and/or significant heart rate changes.    ----------------------------------------- 3:35 PM on 11/20/2024 ----------------------------------------- Patient admitted to OB/GYN service  FINAL CLINICAL IMPRESSION(S) / ED DIAGNOSES   Final diagnoses:  Nausea and vomiting during pregnancy     Rx / DC Orders   ED Discharge Orders     None        Note:  This document was prepared using Dragon voice recognition software and may include unintentional dictation errors.   Dicky Anes, MD 11/20/24 1535  "

## 2024-11-20 NOTE — ED Triage Notes (Signed)
 Pt reports vomiting. Pt [redacted] weeks pregnant. Pt reports her OB informed us  to call to OB floor, charge in L&D called and made aware.

## 2024-11-20 NOTE — H&P (Addendum)
 "       History of Present Illness: Amanda Summers is a 29 y.o. G3P0010 at [redacted]w[redacted]d admitted for hyperemesis.  She has had several days of vomiting that has worsened. She was seen and treated in the ED yesterday. She notes that the nausea and vomiting has continued and she has not been able to keep much down. Patient reports  no uterine contractions or cramping. Her abdomen is sore from vomiting. She reports  no vaginal bleeding or vaginal discharge.   Patient Active Problem List   Diagnosis Date Noted   Supervision of other normal pregnancy, antepartum 10/25/2024   Anemia 10/30/2023   Recurrent major depressive disorder 10/30/2023   Attention deficit hyperactivity disorder (ADHD) 10/30/2023   Ruptured right tubal ectopic pregnancy causing hemoperitoneum 05/10/2022   Obesity, morbid (HCC) 04/08/2021    Past Medical History:  Diagnosis Date   ADHD    Anxiety    Depression    Ectopic pregnancy    2023   GERD (gastroesophageal reflux disease)    No known health problems     Past Surgical History:  Procedure Laterality Date   DIAGNOSTIC LAPAROSCOPY WITH REMOVAL OF ECTOPIC PREGNANCY Right 05/10/2022   Procedure: DIAGNOSTIC LAPAROSCOPY WITH REMOVAL OF ECTOPIC PREGNANCY;  Surgeon: Leonce Garnette BIRCH, MD;  Location: [facility] ORS;  Service: Gynecology;  Laterality: Right;   FOOT SURGERY     LAPAROSCOPIC UNILATERAL SALPINGECTOMY Right 05/10/2022   Procedure: LAPAROSCOPIC UNILATERAL SALPINGECTOMY;  Surgeon: Leonce Garnette BIRCH, MD;  Location: [facility] OR[facility];  Service: Gynecology;  Laterality: Right;    OB History  Gravida Para Term Preterm AB Living  3    1   SAB IAB Ectopic Multiple Live Births    1      # Outcome Date GA Lbr Len/2nd Weight Sex Type Anes PTL Lv  3 Current           2 Ectopic 04/24/22          1 Gravida             Social History   Socioeconomic History   Marital status: Divorced    Spouse name: Not on file   Number of children: Not on file   Years of education: Not on file    Highest education level: Associate degree: academic program  Occupational History   Not on file  Tobacco Use   Smoking status: Never    Passive exposure: Never   Smokeless tobacco: Never  Vaping Use   Vaping status: Never Used  Substance and Sexual Activity   Alcohol use: Not Currently    Comment: occasinal   Drug use: Never   Sexual activity: Yes    Birth control/protection: I.U.D.  Other Topics Concern   Not on file  Social History Narrative   Works as a community education officer at [employer]   Social Drivers of Health   Tobacco Use: Low Risk (11/20/2024)   Patient History    Smoking Tobacco Use: Never    Smokeless Tobacco Use: Never    Passive Exposure: Never  Financial Resource Strain: Low Risk (10/25/2024)   Overall Financial Resource Strain (CARDIA)    Difficulty of Paying Living Expenses: Not hard at all  Food Insecurity: No Food Insecurity (10/25/2024)   Epic    Worried About Radiation Protection Practitioner of Food in the Last Year: Never true    Ran Out of Food in the Last Year: Never true  Recent Concern: Food Insecurity - Food Insecurity Present (08/20/2024)   Epic  Worried About Programme Researcher, Broadcasting/film/video in the Last Year: Sometimes true    [employer] of Food in the Last Year: Never true  Transportation Needs: No Transportation Needs (10/25/2024)   Epic    Lack of Transportation (Medical): No    Lack of Transportation (Non-Medical): No  Physical Activity: Inactive (10/25/2024)   Exercise Vital Sign    Days of Exercise per Week: 0 days    Minutes of Exercise per Session: 0 min  Stress: No Stress Concern Present (10/25/2024)   [employer] of Occupational Health - Occupational Stress Questionnaire    Feeling of Stress: Not at all  Recent Concern: Stress - Stress Concern Present (08/20/2024)   [employer] of Occupational Health - Occupational Stress Questionnaire    Feeling of Stress: Very much  Social Connections: Socially Isolated (10/25/2024)   Social Connection and Isolation Panel     Frequency of Communication with Friends and Family: More than three times a week    Frequency of Social Gatherings with Friends and Family: Once a week    Attends Religious Services: Never    Database Administrator or Organizations: No    Attends Banker Meetings: Never    Marital Status: Divorced  Depression (PHQ2-9): Low Risk (10/25/2024)   Depression (PHQ2-9)    PHQ-2 Score: 0  Alcohol Screen: Low Risk (10/25/2024)   Alcohol Screen    Last Alcohol Screening Score (AUDIT): 0  Housing: Low Risk (10/25/2024)   Epic    Unable to Pay for Housing in the Last Year: No    Number of Times Moved in the Last Year: 0    Homeless in the Last Year: No  Utilities: Not At Risk (10/25/2024)   Epic    Threatened with loss of utilities: No  Health Literacy: Adequate Health Literacy (10/25/2024)   B1300 Health Literacy    Frequency of need for help with medical instructions: Never    Family History  Problem Relation Age of Onset   Cancer Neg Hx    Breast cancer Neg Hx    Ovarian cancer Neg Hx     Allergies[1]  Medications Prior to Admission  Medication Sig Dispense Refill Last Dose/Taking   metoCLOPramide  (REGLAN ) 10 MG tablet Take 1 tablet (10 mg total) by mouth every 8 (eight) hours as needed for nausea. 15 tablet 0    ondansetron  (ZOFRAN -ODT) 8 MG disintegrating tablet Take 1 tablet (8 mg total) by mouth every 8 (eight) hours as needed for nausea or vomiting. 20 tablet 2    Prenatal Vit-Fe Fumarate-FA (MULTIVITAMIN-PRENATAL) 27-0.8 MG TABS tablet Take 1 tablet by mouth daily at 12 noon.      promethazine  (PHENERGAN ) 25 MG tablet Take 1 tablet (25 mg total) by mouth every 6 (six) hours as needed for nausea or vomiting. 30 tablet 2     Review of Systems - Negative except as mentioned in HPI  Vitals:  BP 105/68 (BP Location: Left Arm)   Pulse (!) 109   Temp 98.4 F (36.9 C) (Oral)   Resp 18   Ht 5' 7 (1.702 m)   Wt 110.9 kg   LMP 09/09/2023   SpO2 98%   BMI 38.29  kg/m  Physical Examination: CONSTITUTIONAL: Well-developed, well-nourished female in no acute distress.  HENT:  Normocephalic, atraumatic, External right and left ear normal. Oropharynx is clear and moist EYES: Conjunctivae and EOM are normal. Pupils are equal, round, and reactive to light. No scleral icterus.  NECK: Normal range of  motion, supple, no masses SKIN: Skin is warm and dry. No rash noted. Not diaphoretic. No erythema. No pallor. NEUROLOGIC: Alert and oriented to person, place, and time. Normal reflexes, muscle tone coordination. No cranial nerve deficit noted. PSYCHIATRIC: Normal mood and affect. Normal behavior. Normal judgment and thought content. CARDIOVASCULAR: Normal heart rate noted, regular rhythm RESPIRATORY: Effort and breath sounds normal, no problems with respiration noted ABDOMEN: Soft, nontender, nondistended, gravid. MUSCULOSKELETAL: Normal range of motion. No edema and no tenderness. 2+ distal pulses.  Cervix: Not evaluated.  Membranes:intact Fetal Monitoring:n/a Tocometer: n/a  Labs:  Results for orders placed or performed during the hospital encounter of 11/20/24 (from the past 24 hours)  CBC   Collection Time: 11/20/24  2:30 PM  Result Value Ref Range   WBC 4.5 4.0 - 10.5 K/uL   RBC 4.51 3.87 - 5.11 MIL/uL   Hemoglobin 11.3 (L) 12.0 - 15.0 g/dL   HCT 65.4 (L) 63.9 - 53.9 %   MCV 76.5 (L) 80.0 - 100.0 fL   MCH 25.1 (L) 26.0 - 34.0 pg   MCHC 32.8 30.0 - 36.0 g/dL   RDW 85.3 88.4 - 84.4 %   Platelets 226 150 - 400 K/uL   nRBC 0.0 0.0 - 0.2 %  Comprehensive metabolic panel   Collection Time: 11/20/24  2:30 PM  Result Value Ref Range   Sodium 133 (L) 135 - 145 mmol/L   Potassium 2.9 (L) 3.5 - 5.1 mmol/L   Chloride 98 98 - 111 mmol/L   CO2 21 (L) 22 - 32 mmol/L   Glucose, Bld 93 70 - 99 mg/dL   BUN 6 6 - 20 mg/dL   Creatinine, Ser 9.36 0.44 - 1.00 mg/dL   Calcium  9.8 8.9 - 10.3 mg/dL   Total Protein 8.1 6.5 - 8.1 g/dL   Albumin 4.2 3.5 - 5.0 g/dL    AST 24 15 - 41 U/L   ALT 20 0 - 44 U/L   Alkaline Phosphatase 73 38 - 126 U/L   Total Bilirubin 0.9 0.0 - 1.2 mg/dL   GFR, Estimated >39 >39 mL/min   Anion gap 13 5 - 15  Lipase, blood   Collection Time: 11/20/24  2:30 PM  Result Value Ref Range   Lipase 22 11 - 51 U/L  Beta-hydroxybutyric acid   Collection Time: 11/20/24  2:30 PM  Result Value Ref Range   Beta-Hydroxybutyric Acid 2.00 (H) 0.05 - 0.27 mmol/L  Results for orders placed or performed during the hospital encounter of 11/19/24 (from the past 24 hours)  Urinalysis, Routine w reflex microscopic -Urine, Clean Catch   Collection Time: 11/19/24  7:56 PM  Result Value Ref Range   Color, Urine AMBER (A) YELLOW   APPearance CLEAR (A) CLEAR   Specific Gravity, Urine 1.026 1.005 - 1.030   pH 6.0 5.0 - 8.0   Glucose, UA NEGATIVE NEGATIVE mg/dL   Hgb urine dipstick NEGATIVE NEGATIVE   Bilirubin Urine NEGATIVE NEGATIVE   Ketones, ur 20 (A) NEGATIVE mg/dL   Protein, ur 30 (A) NEGATIVE mg/dL   Nitrite NEGATIVE NEGATIVE   Leukocytes,Ua NEGATIVE NEGATIVE   RBC / HPF 0-5 0 - 5 RBC/hpf   WBC, UA 0-5 0 - 5 WBC/hpf   Bacteria, UA RARE (A) NONE SEEN   Squamous Epithelial / HPF 0-5 0 - 5 /HPF   Mucus PRESENT     Imaging Studies: US  ABDOMEN LIMITED RUQ (LIVER/GB) Result Date: 11/20/2024 CLINICAL DATA:  Abdominal pain. EXAM: ULTRASOUND ABDOMEN LIMITED RIGHT UPPER  QUADRANT COMPARISON:  None Available. FINDINGS: Gallbladder: No gallstones or wall thickening visualized. No sonographic Murphy sign noted by sonographer. Common bile duct: Diameter: 4 mm Liver: No focal lesion identified. Within normal limits in parenchymal echogenicity. Portal vein is patent on color Doppler imaging with normal direction of blood flow towards the liver. Other: None. IMPRESSION: Unremarkable right upper quadrant ultrasound. Electronically Signed   By: Vanetta Chou M.D.   On: 11/20/2024 15:21   US  OB LESS THAN 14 WEEKS WITH OB TRANSVAGINAL Result Date:  10/27/2024 Images from the original result were not included. FIRST TRIMESTER ULTRASOUND REPORT Patient Name: Phoenicia Pirie DOB: 1996-02-02 MRN: [id number] Location: [facility] Date of Service: 10/25/2024 Ordering Provider: Harlene Cisco, CNM Indications:dating Findings: Gerri intrauterine pregnancy is visualized with a CRL consistent with [redacted]w[redacted]d gestation, giving an (U/S) EDD of 06/13/25. The (U/S) EDD is consistent with the clinically established EDD of 06/15/25 based on LMP. FHR: 146 bpm CRL measurement: 9.4 mm Yolk sac is visualized and appears normal. Amnion: visualized and appears normal Right Ovary is normal in appearance. Left Ovary is normal appearance. Corpus luteal cyst:  Left ovary Survey of the adnexa demonstrates no adnexal masses. There is no free peritoneal fluid in the cul de sac. Impression: 1. [redacted]w[redacted]d Viable Singleton Intrauterine pregnancy by U/S. 2. Recommend EDD of 06/13/25 based on U/S. Recommendations: 1.Clinical correlation with the patient's History and Physical Exam. [place]wyn DELENA Hummer, RDMS (AB,OB,BR),RVT Clinical Impression and recommendations: I have reviewed the sonogram results above, combined with the patient's current clinical course, below are my impressions and any appropriate recommendations for management based on the sonographic findings. Viable early IUP G3P0010 Estimated Date of Delivery: 06/13/25 LMP Normal general sonographic findings Recommend routine care unless otherwise clinically indicated Jennifer M Ozan 10/27/2024 7:24 PM     Assessment and Plan: Patient Active Problem List   Diagnosis Date Noted   Supervision of other normal pregnancy, antepartum 10/25/2024   Anemia 10/30/2023   Recurrent major depressive disorder 10/30/2023   Attention deficit hyperactivity disorder (ADHD) 10/30/2023   Ruptured right tubal ectopic pregnancy causing hemoperitoneum 05/10/2022   Obesity, morbid (HCC) 04/08/2021   Admit to Antenatal Hyperemesis orders set  completed Pharmacy consulted on potassium replacement.  NPO Repeat CMP in the morning Dr.Thorpe Notified of admission and plan of care  Zelda Hummer, CNM      [1] No Known Allergies  "

## 2024-11-20 NOTE — Consult Note (Addendum)
 PHARMACY CONSULT NOTE - FOLLOW UP  Pharmacy Consult for Electrolyte Monitoring and Replacement   Recent Labs: Potassium (mmol/L)  Date Value  11/20/2024 2.9 (L)   Calcium  (mg/dL)  Date Value  98/85/7973 9.8   Albumin (g/dL)  Date Value  98/85/7973 4.2  11/03/2023 3.9 (L)   Sodium (mmol/L)  Date Value  11/20/2024 133 (L)  11/03/2023 136     Assessment: 29 y.o. G3P0010 at 107w5d admitted for hyperemesis.  She has had several days of vomiting that has worsened. Currently NPO.   Goal of Therapy:  WNL  Plan:  KCL 10 mEq x 5. Should give potassium close to 3.5.  F/u with AM labs.   Cathaleen GORMAN Blanch ,PharmD Clinical Pharmacist 11/20/2024 4:52 PM

## 2024-11-20 NOTE — ED Notes (Signed)
 See triage note  Presents from [facility] with some n/v  Also having some upper abd pain

## 2024-11-21 LAB — URINE DRUG SCREEN
Amphetamines: NEGATIVE
Barbiturates: NEGATIVE
Benzodiazepines: NEGATIVE
Cocaine: NEGATIVE
Fentanyl: NEGATIVE
Methadone Scn, Ur: NEGATIVE
Opiates: NEGATIVE
Tetrahydrocannabinol: NEGATIVE

## 2024-11-21 LAB — COMPREHENSIVE METABOLIC PANEL WITH GFR
ALT: 17 U/L (ref 0–44)
AST: 20 U/L (ref 15–41)
Albumin: 3.6 g/dL (ref 3.5–5.0)
Alkaline Phosphatase: 60 U/L (ref 38–126)
Anion gap: 13 (ref 5–15)
BUN: <5 mg/dL — ABNORMAL LOW (ref 6–20)
CO2: 20 mmol/L — ABNORMAL LOW (ref 22–32)
Calcium: 9.1 mg/dL (ref 8.9–10.3)
Chloride: 101 mmol/L (ref 98–111)
Creatinine, Ser: 0.64 mg/dL (ref 0.44–1.00)
GFR, Estimated: >60 mL/min
Glucose, Bld: 75 mg/dL (ref 70–99)
Potassium: 3.3 mmol/L — ABNORMAL LOW (ref 3.5–5.1)
Sodium: 134 mmol/L — ABNORMAL LOW (ref 135–145)
Total Bilirubin: 1 mg/dL (ref 0.0–1.2)
Total Protein: 6.7 g/dL (ref 6.5–8.1)

## 2024-11-21 MED ORDER — POTASSIUM CHLORIDE 10 MEQ/100ML IV SOLN
10.0000 meq | INTRAVENOUS | Status: AC
  Administered 2024-11-21: 10 meq via INTRAVENOUS
  Filled 2024-11-21 (×3): qty 100

## 2024-11-21 MED ORDER — LACTATED RINGERS IV SOLN
125.0000 mL/h | INTRAVENOUS | Status: AC
  Administered 2024-11-21 – 2024-11-22 (×3): 125 mL/h via INTRAVENOUS

## 2024-11-21 MED ORDER — ONDANSETRON 4 MG PO TBDP
4.0000 mg | ORAL_TABLET | ORAL | Status: DC | PRN
  Administered 2024-11-21 – 2024-11-22 (×5): 4 mg via ORAL
  Filled 2024-11-21 (×6): qty 1

## 2024-11-21 NOTE — Progress Notes (Signed)
 "  Antenatal Note  Patient ID: Amanda Summers MRN: [id number] DOB/AGE: [birth year] 29 y.o.  Subjective  History of Present Illness: The patient is a 30 y.o. female G3P0010 at [redacted]w[redacted]d who was admitted on 11/20/24 for hyperemesis. Overnight she received IV fluids, IV pecid and IV Reglan  for nausea. Has not received any other medications. Has also been NPO since admission. She was found to have low potassium and pharmacy was consulted for potassium replacement. She tolerated IV potassium yesterday. Her potassium increased 2.9 yesterday to 3.3 today. Additional potassium was ordered today but was not tolerated by patient. IV infusion had to be turned off within of starting due to significant burning pain at IV site. Patient reports she currently is feeling better and would like to try and advance her diet today. She did not vomit throughout the night. Reports two episodes of vomiting this morning that she thinks are strictly related to the pain she experienced from the IV potassium.  Past Medical History:  Diagnosis Date   ADHD    Anxiety    Depression    Ectopic pregnancy    2023   GERD (gastroesophageal reflux disease)    No known health problems     Past Surgical History:  Procedure Laterality Date   DIAGNOSTIC LAPAROSCOPY WITH REMOVAL OF ECTOPIC PREGNANCY Right 05/10/2022   Procedure: DIAGNOSTIC LAPAROSCOPY WITH REMOVAL OF ECTOPIC PREGNANCY;  Surgeon: Leonce Garnette BIRCH, MD;  Location: [facility];  Service: Gynecology;  Laterality: Right;   FOOT SURGERY     LAPAROSCOPIC UNILATERAL SALPINGECTOMY Right 05/10/2022   Procedure: LAPAROSCOPIC UNILATERAL SALPINGECTOMY;  Surgeon: Leonce Garnette BIRCH, MD;  Location: [facility];  Service: Gynecology;  Laterality: Right;    Medications Ordered Prior to Encounter[1]  Allergies[2]  Social History   Socioeconomic History   Marital status: Divorced    Spouse name: Not on file   Number of children: Not on file   Years of education: Not on file    Highest education level: Associate degree: academic program  Occupational History   Not on file  Tobacco Use   Smoking status: Never    Passive exposure: Never   Smokeless tobacco: Never  Vaping Use   Vaping status: Never Used  Substance and Sexual Activity   Alcohol use: Not Currently    Comment: occasinal   Drug use: Never   Sexual activity: Yes    Birth control/protection: I.U.D.  Other Topics Concern   Not on file  Social History Narrative   Works as a community education officer at [employer]   Social Drivers of Health   Tobacco Use: Low Risk (11/20/2024)   Patient History    Smoking Tobacco Use: Never    Smokeless Tobacco Use: Never    Passive Exposure: Never  Financial Resource Strain: Low Risk (10/25/2024)   Overall Financial Resource Strain (CARDIA)    Difficulty of Paying Living Expenses: Not hard at all  Food Insecurity: No Food Insecurity (11/21/2024)   Epic    Worried About Programme Researcher, Broadcasting/film/video in the Last Year: Never true    Ran Out of Food in the Last Year: Never true  Transportation Needs: No Transportation Needs (11/21/2024)   Epic    Lack of Transportation (Medical): No    Lack of Transportation (Non-Medical): No  Physical Activity: Inactive (10/25/2024)   Exercise Vital Sign    Days of Exercise per Week: 0 days    Minutes of Exercise per Session: 0 min  Stress: No Stress Concern Present (10/25/2024)  [employer] of Occupational Health - Occupational Stress Questionnaire    Feeling of Stress: Not at all  Recent Concern: Stress - Stress Concern Present (08/20/2024)   [employer] of Occupational Health - Occupational Stress Questionnaire    Feeling of Stress: Very much  Social Connections: Socially Isolated (10/25/2024)   Social Connection and Isolation Panel    Frequency of Communication with Friends and Family: More than three times a week    Frequency of Social Gatherings with Friends and Family: Once a week    Attends Religious Services: Never    Automotive Engineer or Organizations: No    Attends Banker Meetings: Never    Marital Status: Divorced  Catering Manager Violence: Not At Risk (11/21/2024)   Epic    Fear of Current or Ex-Partner: No    Emotionally Abused: No    Physically Abused: No    Sexually Abused: No  Depression (PHQ2-9): Low Risk (10/25/2024)   Depression (PHQ2-9)    PHQ-2 Score: 0  Alcohol Screen: Low Risk (10/25/2024)   Alcohol Screen    Last Alcohol Screening Score (AUDIT): 0  Housing: Low Risk (11/21/2024)   Epic    Unable to Pay for Housing in the Last Year: No    Number of Times Moved in the Last Year: 0    Homeless in the Last Year: No  Utilities: Not At Risk (11/21/2024)   Epic    Threatened with loss of utilities: No  Health Literacy: Adequate Health Literacy (10/25/2024)   B1300 Health Literacy    Frequency of need for help with medical instructions: Never    Family History  Problem Relation Age of Onset   Cancer Neg Hx    Breast cancer Neg Hx    Ovarian cancer Neg Hx      ROS    Objective  Physical Exam: BP 119/71   Pulse 93   Temp 98.7 F (37.1 C) (Oral)   Resp 19   Ht 5' 7 (1.702 m)   Wt 111.2 kg   LMP 09/09/2023   SpO2 100%   BMI 38.40 kg/m    Constitutional: A&Ox3 Neuro: grossly intact  Significant Findings/ Diagnostic Studies: Potassium 3.3  today   Assessment: 29 y.o. female G3P0010 at [redacted]w[redacted]d  Hyperemesis gravidarum- vomiting improving Low potassium  Plan: Will work on [employer] today.  Plan to start oral zofran . Will give zofran  now and give ice chips later. At next scheduled pepcid  and reglan  will trial PO meds in place of IV meds.  Will give second dose of PO zofran  four hours after first dose. Will next give fluids PO at that time. If well tolerated will attempt crackers. If tolerating PO medications and sips of fluid will contact pharmacy to change IV potassium to oral.      Signed: Lolita Loots CNM, FNP 11/21/2024, 11:19 AM      [1]  No current facility-administered medications on file prior to encounter.   Current Outpatient Medications on File Prior to Encounter  Medication Sig Dispense Refill   ondansetron  (ZOFRAN -ODT) 8 MG disintegrating tablet Take 1 tablet (8 mg total) by mouth every 8 (eight) hours as needed for nausea or vomiting. 20 tablet 2   Prenatal Vit-Fe Fumarate-FA (MULTIVITAMIN-PRENATAL) 27-0.8 MG TABS tablet Take 1 tablet by mouth daily at 12 noon.     promethazine  (PHENERGAN ) 25 MG tablet Take 1 tablet (25 mg total) by mouth every 6 (six) hours as needed for nausea or vomiting.  30 tablet 2   metoCLOPramide  (REGLAN ) 10 MG tablet Take 1 tablet (10 mg total) by mouth every 8 (eight) hours as needed for nausea. 15 tablet 0  [2] No Known Allergies  "

## 2024-11-21 NOTE — Consult Note (Signed)
 PHARMACY CONSULT NOTE - FOLLOW UP  Pharmacy Consult for Electrolyte Monitoring and Replacement   Recent Labs: Potassium (mmol/L)  Date Value  11/21/2024 3.3 (L)   Calcium  (mg/dL)  Date Value  98/84/7973 9.1   Albumin (g/dL)  Date Value  98/84/7973 3.6  11/03/2023 3.9 (L)   Sodium (mmol/L)  Date Value  11/21/2024 134 (L)  11/03/2023 136     Assessment: 29 y.o. G3P0010 at [redacted]w[redacted]d admitted for hyperemesis.  She has had several days of vomiting that has worsened. Currently NPO.   Goal of Therapy:  WNL  Plan:  Administer potassium chloride  mEq IV x 3 doses. F/u with AM labs.   Elsie CHRISTELLA Piety ,PharmD Clinical Pharmacist 11/21/2024 8:30 AM

## 2024-11-21 NOTE — Progress Notes (Incomplete)
 Patient had 1 episode of emesis at approximately 1915. Requested next dose of Reglan  via IV.

## 2024-11-21 NOTE — Progress Notes (Signed)
 Patient could not tolerate IV potassium and asked RN to turn it off.  Will notify CNM.

## 2024-11-22 DIAGNOSIS — O211 Hyperemesis gravidarum with metabolic disturbance: Secondary | ICD-10-CM | POA: Diagnosis present

## 2024-11-22 DIAGNOSIS — K117 Disturbances of salivary secretion: Secondary | ICD-10-CM | POA: Diagnosis present

## 2024-11-22 DIAGNOSIS — O99611 Diseases of the digestive system complicating pregnancy, first trimester: Secondary | ICD-10-CM | POA: Diagnosis present

## 2024-11-22 DIAGNOSIS — O210 Mild hyperemesis gravidarum: Secondary | ICD-10-CM | POA: Diagnosis present

## 2024-11-22 DIAGNOSIS — R1110 Vomiting, unspecified: Secondary | ICD-10-CM | POA: Diagnosis present

## 2024-11-22 DIAGNOSIS — Z3A10 10 weeks gestation of pregnancy: Secondary | ICD-10-CM | POA: Diagnosis not present

## 2024-11-22 LAB — BASIC METABOLIC PANEL WITH GFR
Anion gap: 9 (ref 5–15)
BUN: <5 mg/dL — ABNORMAL LOW (ref 6–20)
CO2: 24 mmol/L (ref 22–32)
Calcium: 9.3 mg/dL (ref 8.9–10.3)
Chloride: 103 mmol/L (ref 98–111)
Creatinine, Ser: 0.62 mg/dL (ref 0.44–1.00)
GFR, Estimated: >60 mL/min
Glucose, Bld: 94 mg/dL (ref 70–99)
Potassium: 3 mmol/L — ABNORMAL LOW (ref 3.5–5.1)
Sodium: 135 mmol/L (ref 135–145)

## 2024-11-22 LAB — TSH: TSH: 0.364 u[IU]/mL (ref 0.350–4.500)

## 2024-11-22 LAB — MAGNESIUM: Magnesium: 1.6 mg/dL — ABNORMAL LOW (ref 1.7–2.4)

## 2024-11-22 MED ORDER — LACTATED RINGERS IV SOLN
125.0000 mL/h | INTRAVENOUS | Status: AC
  Administered 2024-11-22 – 2024-11-23 (×2): 125 mL/h via INTRAVENOUS

## 2024-11-22 MED ORDER — METHYLPREDNISOLONE SODIUM SUCC 125 MG IJ SOLR
48.0000 mg | Freq: Once | INTRAMUSCULAR | Status: AC
  Administered 2024-11-22: 48 mg via INTRAVENOUS
  Filled 2024-11-22: qty 2

## 2024-11-22 MED ORDER — SCOPOLAMINE 1 MG/3DAYS TD PT72
1.0000 | MEDICATED_PATCH | TRANSDERMAL | Status: DC
  Administered 2024-11-22 – 2024-11-25 (×2): 1 mg via TRANSDERMAL
  Filled 2024-11-22 (×2): qty 1

## 2024-11-22 MED ORDER — METHYLPREDNISOLONE 4 MG PO TABS
8.0000 mg | ORAL_TABLET | Freq: Every day | ORAL | Status: DC
  Administered 2024-11-27: 8 mg via ORAL
  Filled 2024-11-22: qty 2

## 2024-11-22 MED ORDER — DOXYLAMINE SUCCINATE (SLEEP) 25 MG PO TABS
25.0000 mg | ORAL_TABLET | Freq: Two times a day (BID) | ORAL | Status: DC
  Administered 2024-11-22 – 2024-11-27 (×10): 25 mg via ORAL
  Filled 2024-11-22 (×11): qty 1

## 2024-11-22 MED ORDER — METHYLPREDNISOLONE 4 MG PO TABS
16.0000 mg | ORAL_TABLET | Freq: Every day | ORAL | Status: AC
  Administered 2024-11-23 – 2024-11-25 (×3): 16 mg via ORAL
  Filled 2024-11-22 (×3): qty 4

## 2024-11-22 MED ORDER — METHYLPREDNISOLONE 4 MG PO TABS: 4.0000 mg | ORAL_TABLET | Freq: Every day | ORAL | Status: DC

## 2024-11-22 MED ORDER — HYDROXYZINE HCL 25 MG PO TABS: 50.0000 mg | ORAL_TABLET | Freq: Four times a day (QID) | ORAL | Status: DC | PRN

## 2024-11-22 MED ORDER — MAGNESIUM SULFATE 2 GM/50ML IV SOLN
2.0000 g | Freq: Once | INTRAVENOUS | Status: AC
  Administered 2024-11-22: 2 g via INTRAVENOUS
  Filled 2024-11-22: qty 50

## 2024-11-22 MED ORDER — PYRIDOXINE HCL 25 MG PO TABS
25.0000 mg | ORAL_TABLET | Freq: Two times a day (BID) | ORAL | Status: DC
  Administered 2024-11-22 – 2024-11-27 (×10): 25 mg via ORAL
  Filled 2024-11-22 (×11): qty 1

## 2024-11-22 MED ORDER — PROMETHAZINE HCL 25 MG PO TABS
12.5000 mg | ORAL_TABLET | ORAL | Status: DC | PRN
  Administered 2024-11-23 – 2024-11-24 (×2): 25 mg via ORAL
  Filled 2024-11-22 (×2): qty 1

## 2024-11-22 MED ORDER — ONDANSETRON 4 MG PO TBDP
4.0000 mg | ORAL_TABLET | Freq: Four times a day (QID) | ORAL | Status: DC
  Administered 2024-11-23 – 2024-11-27 (×14): 4 mg via ORAL
  Filled 2024-11-22 (×16): qty 1

## 2024-11-22 MED ORDER — ONDANSETRON HCL 4 MG/2ML IJ SOLN
4.0000 mg | Freq: Four times a day (QID) | INTRAMUSCULAR | Status: DC
  Administered 2024-11-22 – 2024-11-26 (×6): 4 mg via INTRAVENOUS
  Filled 2024-11-22 (×7): qty 2

## 2024-11-22 MED ORDER — METHYLPREDNISOLONE 4 MG PO TABS
8.0000 mg | ORAL_TABLET | Freq: Every day | ORAL | Status: DC
  Administered 2024-11-26: 8 mg via ORAL
  Filled 2024-11-22: qty 2

## 2024-11-22 MED ORDER — METHYLPREDNISOLONE 4 MG PO TABS
16.0000 mg | ORAL_TABLET | Freq: Every day | ORAL | Status: AC
  Administered 2024-11-23 – 2024-11-26 (×3): 16 mg via ORAL
  Filled 2024-11-22 (×4): qty 4

## 2024-11-22 MED ORDER — METHYLPREDNISOLONE 4 MG PO TABS
16.0000 mg | ORAL_TABLET | Freq: Every day | ORAL | Status: AC
  Administered 2024-11-23 – 2024-11-24 (×2): 16 mg via ORAL
  Filled 2024-11-22 (×2): qty 4

## 2024-11-22 MED ORDER — METHYLPREDNISOLONE 4 MG PO TABS
8.0000 mg | ORAL_TABLET | Freq: Every day | ORAL | Status: DC
  Administered 2024-11-26: 8 mg via ORAL
  Filled 2024-11-22 (×3): qty 2

## 2024-11-22 MED ORDER — HYDROXYZINE HCL 50 MG/ML IM SOLN: 50.0000 mg | Freq: Four times a day (QID) | INTRAMUSCULAR | Status: DC | PRN

## 2024-11-22 MED ORDER — POTASSIUM CHLORIDE 10 MEQ/100ML IV SOLN
10.0000 meq | INTRAVENOUS | Status: AC
  Administered 2024-11-22 (×4): 10 meq via INTRAVENOUS
  Filled 2024-11-22 (×4): qty 100

## 2024-11-22 MED ORDER — PROMETHAZINE HCL 25 MG RE SUPP
12.5000 mg | RECTAL | Status: DC | PRN
  Administered 2024-11-25: 12.5 mg via RECTAL
  Filled 2024-11-22 (×3): qty 1

## 2024-11-22 MED ORDER — SODIUM CHLORIDE 0.9 % IV SOLN
8.0000 mg | Freq: Four times a day (QID) | INTRAVENOUS | Status: DC
  Filled 2024-11-22: qty 4

## 2024-11-22 NOTE — Consult Note (Signed)
 PHARMACY CONSULT NOTE - FOLLOW UP  Pharmacy Consult for Electrolyte Monitoring and Replacement   Recent Labs: Potassium (mmol/L)  Date Value  11/22/2024 3.0 (L)   Magnesium  (mg/dL)  Date Value  98/83/7973 1.6 (L)   Calcium  (mg/dL)  Date Value  98/83/7973 9.3   Albumin (g/dL)  Date Value  98/84/7973 3.6  11/03/2023 3.9 (L)   Sodium (mmol/L)  Date Value  11/22/2024 135  11/03/2023 136     Assessment: 29 y.o. G3P0010 at [redacted]w[redacted]d admitted for hyperemesis.  She has had several days of vomiting that has worsened. Currently NPO.   Goal of Therapy:  Electrolytes WNL  Plan:  ---10 mEq IV KCl x 4 ---2 grams IV magnesium  sulfate x 1 ---F/u with AM labs.   Adriana JONETTA Bolster ,PharmD Clinical Pharmacist 11/22/2024 10:06 AM

## 2024-11-22 NOTE — Progress Notes (Signed)
 Daily Antepartum Note  Admission Date: 11/20/2024 Current Date: 11/22/2024 2:23 PM  Amanda Summers is a 29 y.o. G3P0010 at [redacted]w[redacted]d by L/7, HD#3, admitted for hyperemesis.  Pregnancy complicated by:  Patient Active Problem List   Diagnosis Date Noted   Supervision of other normal pregnancy, antepartum 10/25/2024   Anemia 10/30/2023   Recurrent major depressive disorder 10/30/2023   Attention deficit hyperactivity disorder (ADHD) 10/30/2023   Ruptured right tubal ectopic pregnancy causing hemoperitoneum 05/10/2022   Obesity, morbid (HCC) 04/08/2021    Overnight/24hr events:  Emesis x2, unable to tolerate po medications  Subjective:  Amanda Summers reports nausea continues, tried juice & crackers but was unable to keep down. Is spitting frequently. Denies cramping or pain, vaginal bleeding or discharge. Urinating without problem. Has not had a bowel movement in days, unsure of last one.  Objective:   Vitals:   11/22/24 0743 11/22/24 1136  BP: 97/68 108/69  Pulse: 99 90  Resp: 18 18  Temp: 98.8 F (37.1 C) 98.9 F (37.2 C)  SpO2: 100%    Temp:  [98.5 F (36.9 C)-99.8 F (37.7 C)] 98.9 F (37.2 C) (01/16 1136) Pulse Rate:  [86-100] 90 (01/16 1136) Resp:  [18] 18 (01/16 1136) BP: (97-128)/(60-76) 108/69 (01/16 1136) SpO2:  [96 %-100 %] 100 % (01/16 0743) Weight:  [887 kg] 112 kg (01/16 0400) Temp (24hrs), Avg:98.9 F (37.2 C), Min:98.5 F (36.9 C), Max:99.8 F (37.7 C)   Intake/Output Summary (Last 24 hours) at 11/22/2024 1423 Last data filed at 11/22/2024 1215 Gross per 24 hour  Intake 1800.42 ml  Output 2400 ml  Net -599.58 ml     Current Vital Signs 24h Vital Sign Ranges  T 98.9 F (37.2 C) Temp  Avg: 98.9 F (37.2 C)  Min: 98.5 F (36.9 C)  Max: 99.8 F (37.7 C)  BP 108/69 BP  Min: 97/68  Max: 128/76  HR 90 Pulse  Avg: 94.3  Min: 86  Max: 100  RR 18 Resp  Avg: 18  Min: 18  Max: 18  SaO2 100 % Room Air SpO2  Avg: 98.8 %  Min: 96 %  Max: 100 %       24 Hour I/O  Current Shift I/O  Time Ins Outs 01/15 0701 - 01/16 0700 In: 1997.8 [P.O.:1200; I.V.:797.8] Out: 2100 [Urine:2100] 01/16 0701 - 01/16 1900 In: 240 [P.O.:240] Out: 800 [Urine:800]   Patient Vitals for the past 24 hrs:  BP Temp Temp src Pulse Resp SpO2 Weight  11/22/24 1136 108/69 98.9 F (37.2 C) Oral 90 18 -- --  11/22/24 0743 97/68 98.8 F (37.1 C) Oral 99 18 100 % --  11/22/24 0400 -- -- -- -- -- -- 112 kg  11/22/24 0333 120/70 98.5 F (36.9 C) Oral 100 18 96 % --  11/21/24 2354 102/60 98.7 F (37.1 C) Oral 86 18 99 % --  11/21/24 1953 119/73 99.8 F (37.7 C) Oral 94 18 100 % --  11/21/24 1621 128/76 98.6 F (37 C) Oral 97 18 -- --    Fetal Heart Tones: 150   Physical exam: General: Well nourished, well developed female in no acute distress. Abdomen: gravid, soft nontender, FHR easily auscultated with doppler Cardiovascular: S1, S2 normal, no murmur, rub or gallop, regular rate and rhythm Respiratory: CTAB Extremities: no clubbing, cyanosis or edema Skin: Warm and dry.   Medications: Current Facility-Administered Medications  Medication Dose Route Frequency Provider Last Rate Last Admin   acetaminophen  (TYLENOL ) tablet 650 mg  650 mg  Oral Q4H PRN Sebastian Sham, CNM   650 mg at 11/20/24 1639   calcium  carbonate (TUMS - dosed in mg elemental calcium ) chewable tablet 400 mg of elemental calcium   2 tablet Oral Q4H PRN Sebastian Sham, CNM       docusate sodium  (COLACE) capsule 100 mg  100 mg Oral Daily Sebastian Sham, CNM       pyridOXINE  (VITAMIN B6) tablet 25 mg  25 mg Oral BID Jayne Harlene CROME, CNM   25 mg at 11/22/24 1203   And   doxylamine  (Sleep) (UNISOM ) tablet 25 mg  25 mg Oral BID Jayne Harlene CROME, CNM   25 mg at 11/22/24 1203   famotidine  (PEPCID ) tablet 20 mg  20 mg Oral Q12H Sebastian Sham, CNM       Or   famotidine  (PEPCID ) IVPB 20 mg premix  20 mg Intravenous Q12H Sebastian Sham, CNM 100 mL/hr at 11/22/24 0944 20 mg at 11/22/24 0944    hydrOXYzine  (ATARAX ) tablet 50 mg  50 mg Oral Q6H PRN Jayne Harlene CROME, CNM       Or   hydrOXYzine  (VISTARIL ) injection 50 mg  50 mg Intramuscular Q6H PRN Jayne Harlene CROME, CNM       [START ON 11/23/2024] methylPREDNISolone  (MEDROL ) tablet 16 mg  16 mg Oral Q breakfast Jayne Harlene CROME, CNM       Followed by   NOREEN ON 11/27/2024] methylPREDNISolone  (MEDROL ) tablet 8 mg  8 mg Oral Q breakfast Jayne Harlene CROME, CNM       Followed by   NOREEN ON 12/04/2024] methylPREDNISolone  (MEDROL ) tablet 4 mg  4 mg Oral Q breakfast Jayne Harlene CROME, CNM       [START ON 11/23/2024] methylPREDNISolone  (MEDROL ) tablet 16 mg  16 mg Oral Q1400 Jayne Harlene CROME, CNM       Followed by   NOREEN ON 11/25/2024] methylPREDNISolone  (MEDROL ) tablet 8 mg  8 mg Oral Q1400 Jayne Harlene CROME, CNM       Followed by   NOREEN ON 11/28/2024] methylPREDNISolone  (MEDROL ) tablet 4 mg  4 mg Oral Q1400 Jayne Harlene CROME, CNM       [START ON 11/23/2024] methylPREDNISolone  (MEDROL ) tablet 16 mg  16 mg Oral QHS Jayne Harlene CROME, CNM       Followed by   NOREEN ON 11/26/2024] methylPREDNISolone  (MEDROL ) tablet 8 mg  8 mg Oral QHS Jayne Harlene CROME, CNM       Followed by   NOREEN ON 11/29/2024] methylPREDNISolone  (MEDROL ) tablet 4 mg  4 mg Oral QHS Jayne Harlene CROME, CNM       metoCLOPramide  (REGLAN ) tablet 10 mg  10 mg Oral Q6H Sebastian Sham, CNM   10 mg at 11/21/24 1355   Or   metoCLOPramide  (REGLAN ) injection 10 mg  10 mg Intravenous Q6H Sebastian Sham, CNM   10 mg at 11/22/24 9257   morphine  (PF) 2 MG/ML injection 2 mg  2 mg Intravenous Q3H PRN Sebastian Sham, CNM       ondansetron  (ZOFRAN -ODT) disintegrating tablet 4 mg  4 mg Oral Q6H Jayne Harlene CROME, CNM       Or   ondansetron  (ZOFRAN ) injection 4 mg  4 mg Intravenous Q6H Jayne Harlene CROME, CNM       Or   ondansetron  (ZOFRAN ) 8 mg in sodium chloride  0.9 % 50 mL IVPB  8 mg Intravenous Q6H Yossef Gilkison L, CNM       potassium chloride  10 mEq in  100 mL IVPB  10 mEq  Intravenous Q1 Hr x 4 Nada Adriana BIRCH, RPH 100 mL/hr at 11/22/24 1315 10 mEq at 11/22/24 1315   promethazine  (PHENERGAN ) tablet 12.5-25 mg  12.5-25 mg Oral Q4H PRN Jayne Harlene CROME, CNM       Or   promethazine  (PHENERGAN ) suppository 12.5-25 mg  12.5-25 mg Rectal Q4H PRN Jayne Harlene CROME, CNM       zolpidem  (AMBIEN ) tablet 5 mg  5 mg Oral QHS PRN Sebastian Sham, CNM        Labs:  Recent Labs  Lab 11/19/24 1451 11/20/24 1430  WBC 4.6 4.5  HGB 11.6* 11.3*  HCT 35.0* 34.5*  PLT 236 226    Recent Labs  Lab 11/19/24 1451 11/20/24 1430 11/21/24 0533 11/22/24 0910  NA 130* 133* 134* 135  K 3.4* 2.9* 3.3* 3.0*  CL 95* 98 101 103  CO2 19* 21* 20* 24  BUN 7 6 <5* <5*  CREATININE 0.64 0.63 0.64 0.62  CALCIUM  9.7 9.8 9.1 9.3  PROT 8.0 8.1 6.7  --   BILITOT 0.9 0.9 1.0  --   ALKPHOS 77 73 60  --   ALT 25 20 17   --   AST 24 24 20   --   GLUCOSE 78 93 75 94       Assessment & Plan:  Hyperemesis with electrolyte changes, continued inability to tolerate po. -Antiemetics ordered as scheduled, doxylamine /B6 added & zofran  changed to scheduled, continue metoclopramide  -Given persistence of symptoms despite 2d of treatment steroid taper added -Continue famotidine  for PPX -Ptyalism-scopolamine  patch ordered -NPO except sips for meds, advance to clears once no emesis x24h -Continue IVF for hydration, strict I&O -Electrolyte replacement per pharmacy

## 2024-11-23 LAB — BASIC METABOLIC PANEL WITH GFR
Anion gap: 9 (ref 5–15)
BUN: <5 mg/dL — ABNORMAL LOW (ref 6–20)
CO2: 24 mmol/L (ref 22–32)
Calcium: 9.1 mg/dL (ref 8.9–10.3)
Chloride: 105 mmol/L (ref 98–111)
Creatinine, Ser: 0.59 mg/dL (ref 0.44–1.00)
GFR, Estimated: >60 mL/min
Glucose, Bld: 89 mg/dL (ref 70–99)
Potassium: 3 mmol/L — ABNORMAL LOW (ref 3.5–5.1)
Sodium: 138 mmol/L (ref 135–145)

## 2024-11-23 LAB — MAGNESIUM: Magnesium: 1.8 mg/dL (ref 1.7–2.4)

## 2024-11-23 MED ORDER — MAGNESIUM SULFATE 2 GM/50ML IV SOLN
2.0000 g | Freq: Once | INTRAVENOUS | Status: AC
  Administered 2024-11-23: 2 g via INTRAVENOUS
  Filled 2024-11-23: qty 50

## 2024-11-23 MED ORDER — POTASSIUM CHLORIDE 2 MEQ/ML IV SOLN
INTRAVENOUS | Status: DC
  Filled 2024-11-23 (×5): qty 1000

## 2024-11-23 MED ORDER — POTASSIUM CHLORIDE 10 MEQ/100ML IV SOLN
10.0000 meq | INTRAVENOUS | Status: AC
  Administered 2024-11-23 (×4): 10 meq via INTRAVENOUS
  Filled 2024-11-23 (×4): qty 100

## 2024-11-23 MED ORDER — SODIUM CHLORIDE 0.9 % IV SOLN: INTRAVENOUS | Status: AC | PRN

## 2024-11-23 NOTE — Consult Note (Signed)
 PHARMACY CONSULT NOTE - FOLLOW UP  Pharmacy Consult for Electrolyte Monitoring and Replacement   Recent Labs: Potassium (mmol/L)  Date Value  11/23/2024 3.0 (L)   Magnesium  (mg/dL)  Date Value  98/82/7973 1.8   Calcium  (mg/dL)  Date Value  98/82/7973 9.1   Albumin (g/dL)  Date Value  98/84/7973 3.6  11/03/2023 3.9 (L)   Sodium (mmol/L)  Date Value  11/23/2024 138  11/03/2023 136     Assessment: 29 y.o. G3P0010 at [redacted]w[redacted]d admitted for hyperemesis.  She has had several days of vomiting that has worsened. Currently NPO.   Goal of Therapy:  Electrolytes WNL  Plan:  KCL 10 mEq IV x 6. Pt is on LR @ 125 ml/hr. Will add 20 mEq of potassium into LR bag.   Mg 2 g IV x 1 F/u with AM labs.   Amanda Summers ,PharmD Clinical Pharmacist 11/23/2024 8:53 AM

## 2024-11-23 NOTE — Progress Notes (Signed)
 Daily Antepartum Note  Admission Date: 11/20/2024 Current Date: 11/23/2024 11:50 AM  Amanda Summers is a 29 y.o. G3P0010 at [redacted]w[redacted]d by US , HD#4, admitted for hyperemsis.  Pregnancy complicated by:  Patient Active Problem List   Diagnosis Date Noted   Ptyalism 11/22/2024   Hyperemesis affecting pregnancy, antepartum 11/22/2024   Supervision of other normal pregnancy, antepartum 10/25/2024   Anemia 10/30/2023   Recurrent major depressive disorder 10/30/2023   Attention deficit hyperactivity disorder (ADHD) 10/30/2023   Ruptured right tubal ectopic pregnancy causing hemoperitoneum 05/10/2022   Obesity, morbid (HCC) 04/08/2021    Overnight/24hr events:  No emesis since yesterday morning.  Subjective:  Amanda Summers reports that her nausea and vomiting have improved significantly. She has been able to tolerate sips of fluids. She is voiding. She believes her last BM was ~10 days ago.  Objective:   Vitals:   11/23/24 0300 11/23/24 0832  BP: (!) 119/58 (!) 104/59  Pulse: 96 96  Resp: 18 20  Temp: 99 F (37.2 C) 98 F (36.7 C)  SpO2: 99% 99%   Temp:  [98 F (36.7 C)-99 F (37.2 C)] 98 F (36.7 C) (01/17 0832) Pulse Rate:  [76-96] 96 (01/17 0832) Resp:  [18-20] 20 (01/17 0832) BP: (104-119)/(58-68) 104/59 (01/17 0832) SpO2:  [99 %-100 %] 99 % (01/17 0832) Weight:  [111.6 kg] 111.6 kg (01/17 0300) Temp (24hrs), Avg:98.5 F (36.9 C), Min:98 F (36.7 C), Max:99 F (37.2 C)   Intake/Output Summary (Last 24 hours) at 11/23/2024 1150 Last data filed at 11/23/2024 0609 Gross per 24 hour  Intake 4572.9 ml  Output 3850 ml  Net 722.9 ml     Current Vital Signs 24h Vital Sign Ranges  T 98 F (36.7 C) Temp  Avg: 98.5 F (36.9 C)  Min: 98 F (36.7 C)  Max: 99 F (37.2 C)  BP (!) 104/59 BP  Min: 104/59  Max: 119/58  HR 96 Pulse  Avg: 90  Min: 76  Max: 96  RR 20 Resp  Avg: 18.5  Min: 18  Max: 20  SaO2 99 % Room Air SpO2  Avg: 99.3 %  Min: 99 %  Max: 100 %       24 Hour I/O Current  Shift I/O  Time Ins Outs 01/16 0701 - 01/17 0700 In: 4812.9 [P.O.:240; I.V.:3916.7] Out: 3850 [Urine:3850] No intake/output data recorded.   Patient Vitals for the past 24 hrs:  BP Temp Temp src Pulse Resp SpO2 Weight  11/23/24 0832 (!) 104/59 98 F (36.7 C) -- 96 20 99 % --  11/23/24 0300 (!) 119/58 99 F (37.2 C) Oral 96 18 99 % 111.6 kg  11/22/24 1926 116/68 98.4 F (36.9 C) Oral 92 18 100 % --  11/22/24 1548 117/65 98.7 F (37.1 C) Oral 76 18 -- --     Physical exam: General: Well nourished, well developed female in no acute distress. Abdomen:soft, non-tender Cardiovascular: normal rate Respiratory:  normal respiratory effort Skin: Warm and dry.   Medications: Current Facility-Administered Medications  Medication Dose Route Frequency Provider Last Rate Last Admin   0.9 %  sodium chloride  infusion   Intravenous PRN Sebastian Sham, CNM 10 mL/hr at 11/23/24 1008 New Bag at 11/23/24 1008   acetaminophen  (TYLENOL ) tablet 650 mg  650 mg Oral Q4H PRN Thompson, Annie, CNM   650 mg at 11/20/24 1639   calcium  carbonate (TUMS - dosed in mg elemental calcium ) chewable tablet 400 mg of elemental calcium   2 tablet Oral Q4H PRN Sebastian Sham,  CNM       docusate sodium  (COLACE) capsule 100 mg  100 mg Oral Daily Sebastian Sham, CNM   100 mg at 11/23/24 1002   pyridOXINE  (VITAMIN B6) tablet 25 mg  25 mg Oral BID Jayne Harlene CROME, CNM   25 mg at 11/23/24 1002   And   doxylamine  (Sleep) (UNISOM ) tablet 25 mg  25 mg Oral BID Jayne Harlene CROME, CNM   25 mg at 11/23/24 1003   famotidine  (PEPCID ) tablet 20 mg  20 mg Oral Q12H Sebastian Sham, CNM       Or   famotidine  (PEPCID ) IVPB 20 mg premix  20 mg Intravenous Q12H Sebastian Sham, CNM 100 mL/hr at 11/23/24 1002 20 mg at 11/23/24 1002   hydrOXYzine  (ATARAX ) tablet 50 mg  50 mg Oral Q6H PRN Jayne Harlene CROME, CNM       Or   hydrOXYzine  (VISTARIL ) injection 50 mg  50 mg Intramuscular Q6H PRN Jayne Harlene CROME, CNM       lactated  ringers  1,000 mL with potassium chloride  20 mEq infusion   Intravenous Continuous Tobie Cathaleen RAMAN, RPH 125 mL/hr at 11/23/24 9040 New Bag at 11/23/24 0959   lactated ringers  infusion  125 mL/hr Intravenous Continuous Patel, Kishan S, RPH 125 mL/hr at 11/23/24 9390 Infusion Verify at 11/23/24 9390   methylPREDNISolone  (MEDROL ) tablet 16 mg  16 mg Oral Q breakfast Jayne Harlene CROME, CNM   16 mg at 11/23/24 9185   Followed by   NOREEN ON 11/27/2024] methylPREDNISolone  (MEDROL ) tablet 8 mg  8 mg Oral Q breakfast Jayne Harlene CROME, CNM       Followed by   NOREEN ON 12/04/2024] methylPREDNISolone  (MEDROL ) tablet 4 mg  4 mg Oral Q breakfast Jayne Harlene CROME, CNM       methylPREDNISolone  (MEDROL ) tablet 16 mg  16 mg Oral Q1400 Jayne Harlene CROME, CNM       Followed by   NOREEN ON 11/25/2024] methylPREDNISolone  (MEDROL ) tablet 8 mg  8 mg Oral Q1400 Jayne Harlene CROME, CNM       Followed by   NOREEN ON 11/28/2024] methylPREDNISolone  (MEDROL ) tablet 4 mg  4 mg Oral Q1400 Jayne Harlene CROME, CNM       methylPREDNISolone  (MEDROL ) tablet 16 mg  16 mg Oral QHS Jayne Harlene CROME, CNM       Followed by   NOREEN ON 11/26/2024] methylPREDNISolone  (MEDROL ) tablet 8 mg  8 mg Oral QHS Jayne Harlene CROME, CNM       Followed by   NOREEN ON 11/29/2024] methylPREDNISolone  (MEDROL ) tablet 4 mg  4 mg Oral QHS Jayne Harlene CROME, CNM       metoCLOPramide  (REGLAN ) tablet 10 mg  10 mg Oral Q6H Sebastian Sham, CNM   10 mg at 11/21/24 1355   Or   metoCLOPramide  (REGLAN ) injection 10 mg  10 mg Intravenous Q6H Sebastian Sham, CNM   10 mg at 11/23/24 9185   ondansetron  (ZOFRAN -ODT) disintegrating tablet 4 mg  4 mg Oral Q6H Jayne Harlene CROME, CNM       Or   ondansetron  (ZOFRAN ) injection 4 mg  4 mg Intravenous Q6H Jayne Harlene L, CNM   4 mg at 11/23/24 1003   Or   ondansetron  (ZOFRAN ) 8 mg in sodium chloride  0.9 % 50 mL IVPB  8 mg Intravenous Q6H Jayne Harlene CROME, CNM       potassium chloride  10 mEq in 100  mL IVPB  10 mEq Intravenous Q1 Hr x 4 Sebastian Sham,  CNM 100 mL/hr at 11/23/24 1114 10 mEq at 11/23/24 1114   promethazine  (PHENERGAN ) tablet 12.5-25 mg  12.5-25 mg Oral Q4H PRN Jayne Harlene CROME, CNM       Or   promethazine  (PHENERGAN ) suppository 12.5-25 mg  12.5-25 mg Rectal Q4H PRN Jayne Harlene CROME, CNM       scopolamine  (TRANSDERM-SCOP) 1 MG/3DAYS 1 mg  1 patch Transdermal Q72H Jayne Harlene CROME, CNM   1 mg at 11/22/24 1527   zolpidem  (AMBIEN ) tablet 5 mg  5 mg Oral QHS PRN Sebastian Sham, CNM        Labs:  Recent Labs  Lab 11/19/24 1451 11/20/24 1430  WBC 4.6 4.5  HGB 11.6* 11.3*  HCT 35.0* 34.5*  PLT 236 226    Recent Labs  Lab 11/19/24 1451 11/20/24 1430 11/21/24 0533 11/22/24 0910 11/23/24 0733  NA 130* 133* 134* 135 138  K 3.4* 2.9* 3.3* 3.0* 3.0*  CL 95* 98 101 103 105  CO2 19* 21* 20* 24 24  BUN 7 6 <5* <5* <5*  CREATININE 0.64 0.63 0.64 0.62 0.59  CALCIUM  9.7 9.8 9.1 9.3 9.1  PROT 8.0 8.1 6.7  --   --   BILITOT 0.9 0.9 1.0  --   --   ALKPHOS 77 73 60  --   --   ALT 25 20 17   --   --   AST 24 24 20   --   --   GLUCOSE 78 93 75 94 89       Assessment & Plan:  -Continue current hyperemesis protocol but will trial PO meds instead of IV. -Diet: clear liquids. Advance to bland tomorrow if tolerating -Electrolyte replacement per pharmacy -Strict I&O and daily weights

## 2024-11-24 LAB — RENAL FUNCTION PANEL
Albumin: 3.3 g/dL — ABNORMAL LOW (ref 3.5–5.0)
Anion gap: 8 (ref 5–15)
BUN: <5 mg/dL — ABNORMAL LOW (ref 6–20)
CO2: 23 mmol/L (ref 22–32)
Calcium: 9 mg/dL (ref 8.9–10.3)
Chloride: 103 mmol/L (ref 98–111)
Creatinine, Ser: 0.6 mg/dL (ref 0.44–1.00)
GFR, Estimated: >60 mL/min
Glucose, Bld: 92 mg/dL (ref 70–99)
Phosphorus: 2.4 mg/dL — ABNORMAL LOW (ref 2.5–4.6)
Potassium: 3.8 mmol/L (ref 3.5–5.1)
Sodium: 135 mmol/L (ref 135–145)

## 2024-11-24 LAB — MAGNESIUM: Magnesium: 1.8 mg/dL (ref 1.7–2.4)

## 2024-11-24 LAB — GLUCOSE, RANDOM: Glucose, Bld: 95 mg/dL (ref 70–99)

## 2024-11-24 MED ORDER — MAGNESIUM SULFATE 4 GM/100ML IV SOLN
4.0000 g | Freq: Once | INTRAVENOUS | Status: AC
  Administered 2024-11-24: 4 g via INTRAVENOUS
  Filled 2024-11-24: qty 100

## 2024-11-24 MED ORDER — POTASSIUM CHLORIDE 2 MEQ/ML IV SOLN
INTRAVENOUS | Status: AC
  Filled 2024-11-24 (×3): qty 1000

## 2024-11-24 NOTE — Consult Note (Addendum)
 PHARMACY CONSULT NOTE - FOLLOW UP  Pharmacy Consult for Electrolyte Monitoring and Replacement   Recent Labs: Potassium (mmol/L)  Date Value  11/24/2024 3.8   Magnesium  (mg/dL)  Date Value  98/81/7973 1.8   Calcium  (mg/dL)  Date Value  98/81/7973 9.0   Albumin (g/dL)  Date Value  98/81/7973 3.3 (L)  11/03/2023 3.9 (L)   Phosphorus (mg/dL)  Date Value  98/81/7973 2.4 (L)   Sodium (mmol/L)  Date Value  11/24/2024 135  11/03/2023 136     Assessment: 29 y.o. G3P0010 at [redacted]w[redacted]d admitted for hyperemesis.  She has had several days of vomiting that has worsened. Currently NPO.   Goal of Therapy:  Electrolytes WNL  Plan:  Continue LR with Kcl 20 mEq @ 125 ml/hr until end time.  Mg 4 g IV x 1, help absorb potassium better.  Medical team will trial PO meds.  F/u with AM labs.   Cathaleen GORMAN Blanch ,PharmD Clinical Pharmacist 11/24/2024 7:42 AM

## 2024-11-24 NOTE — Progress Notes (Signed)
 Daily Antepartum Note  Admission Date: 11/20/2024 Current Date: 11/24/2024 2:08 PM  Amanda Summers is a 29 y.o. G3P0010 at 100w2d by US , HD#5, admitted for hyperemsis.  Pregnancy complicated by:  Patient Active Problem List   Diagnosis Date Noted   Ptyalism 11/22/2024   Hyperemesis affecting pregnancy, antepartum 11/22/2024   Supervision of other normal pregnancy, antepartum 10/25/2024   Anemia 10/30/2023   Recurrent major depressive disorder 10/30/2023   Attention deficit hyperactivity disorder (ADHD) 10/30/2023   Ruptured right tubal ectopic pregnancy causing hemoperitoneum 05/10/2022   Obesity, morbid (HCC) 04/08/2021    Overnight/24hr events:  No emesis >24 hrs. Tolerating po meds and clears.   Subjective:  Ready to try bland foods. Not very interested in going home today as she's worried that she'll have to be readmitted.  Objective:   Vitals:   11/24/24 1125 11/24/24 1317  BP: 109/65 132/89  Pulse: 79 94  Resp: 20 20  Temp: 98.6 F (37 C) 98.8 F (37.1 C)  SpO2:  97%   Temp:  [98.4 F (36.9 C)-99.1 F (37.3 C)] 98.8 F (37.1 C) (01/18 1317) Pulse Rate:  [79-94] 94 (01/18 1317) Resp:  [18-20] 20 (01/18 1317) BP: (102-132)/(65-89) 132/89 (01/18 1317) SpO2:  [97 %-100 %] 97 % (01/18 1317) Weight:  [111.8 kg] 111.8 kg (01/18 0300) Temp (24hrs), Avg:98.8 F (37.1 C), Min:98.4 F (36.9 C), Max:99.1 F (37.3 C)   Intake/Output Summary (Last 24 hours) at 11/24/2024 1408 Last data filed at 11/24/2024 1343 Gross per 24 hour  Intake 4755.94 ml  Output 5200 ml  Net -444.06 ml     Current Vital Signs 24h Vital Sign Ranges  T 98.8 F (37.1 C) Temp  Avg: 98.8 F (37.1 C)  Min: 98.4 F (36.9 C)  Max: 99.1 F (37.3 C)  BP 132/89 BP  Min: 102/71  Max: 132/89  HR 94 Pulse  Avg: 86  Min: 79  Max: 94  RR 20 Resp  Avg: 18.8  Min: 18  Max: 20  SaO2 97 % Room Air SpO2  Avg: 99.2 %  Min: 97 %  Max: 100 %       24 Hour I/O Current Shift I/O  Time Ins Outs 01/17 0701  - 01/18 0700 In: 1810.5 [P.O.:600; I.V.:760.5] Out: 3600 [Urine:3600] 01/18 0701 - 01/18 1900 In: 2945.4 [P.O.:360; I.V.:2485.4] Out: 1600 [Urine:1400]   Patient Vitals for the past 24 hrs:  BP Temp Temp src Pulse Resp SpO2 Weight  11/24/24 1317 132/89 98.8 F (37.1 C) Oral 94 20 97 % --  11/24/24 1125 109/65 98.6 F (37 C) Oral 79 20 -- --  11/24/24 0821 108/70 98.4 F (36.9 C) Oral 85 19 100 % --  11/24/24 0300 -- -- -- -- -- -- 111.8 kg  11/24/24 0246 116/73 98.7 F (37.1 C) Oral 86 18 100 % --  11/23/24 2053 119/71 99.1 F (37.3 C) Oral 87 18 100 % --  11/23/24 1620 102/71 99 F (37.2 C) Oral 85 18 99 % --     Physical exam: General: Well nourished, well developed female in no acute distress. Abdomen:soft, non-tender Cardiovascular: normal rate Respiratory:  normal respiratory effort Skin: Warm and dry.   Medications: Current Facility-Administered Medications  Medication Dose Route Frequency Provider Last Rate Last Admin   acetaminophen  (TYLENOL ) tablet 650 mg  650 mg Oral Q4H PRN Thompson, Annie, CNM   650 mg at 11/20/24 1639   calcium  carbonate (TUMS - dosed in mg elemental calcium ) chewable tablet 400 mg  of elemental calcium   2 tablet Oral Q4H PRN Sebastian Sham, CNM   400 mg of elemental calcium  at 11/23/24 1814   docusate sodium  (COLACE) capsule 100 mg  100 mg Oral Daily Sebastian Sham, CNM   100 mg at 11/24/24 1016   pyridOXINE  (VITAMIN B6) tablet 25 mg  25 mg Oral BID Albrecht, Jessica L, CNM   25 mg at 11/24/24 1016   And   doxylamine  (Sleep) (UNISOM ) tablet 25 mg  25 mg Oral BID Albrecht, Jessica L, CNM   25 mg at 11/24/24 1016   famotidine  (PEPCID ) tablet 20 mg  20 mg Oral Q12H Sebastian Sham, CNM   20 mg at 11/24/24 1016   Or   famotidine  (PEPCID ) IVPB 20 mg premix  20 mg Intravenous Q12H Sebastian Sham, CNM   Stopped at 11/23/24 1032   hydrOXYzine  (ATARAX ) tablet 50 mg  50 mg Oral Q6H PRN Jayne Harlene CROME, CNM       Or   hydrOXYzine  (VISTARIL )  injection 50 mg  50 mg Intramuscular Q6H PRN Jayne Harlene CROME, CNM       methylPREDNISolone  (MEDROL ) tablet 16 mg  16 mg Oral Q breakfast Jayne Harlene CROME, CNM   16 mg at 11/24/24 9161   Followed by   NOREEN ON 11/27/2024] methylPREDNISolone  (MEDROL ) tablet 8 mg  8 mg Oral Q breakfast Jayne Harlene CROME, CNM       Followed by   NOREEN ON 12/04/2024] methylPREDNISolone  (MEDROL ) tablet 4 mg  4 mg Oral Q breakfast Jayne Harlene CROME, CNM       methylPREDNISolone  (MEDROL ) tablet 16 mg  16 mg Oral Q1400 Jayne Harlene CROME, CNM   16 mg at 11/23/24 1432   Followed by   NOREEN ON 11/25/2024] methylPREDNISolone  (MEDROL ) tablet 8 mg  8 mg Oral Q1400 Jayne Harlene CROME, CNM       Followed by   NOREEN ON 11/28/2024] methylPREDNISolone  (MEDROL ) tablet 4 mg  4 mg Oral Q1400 Jayne Harlene CROME, CNM       methylPREDNISolone  (MEDROL ) tablet 16 mg  16 mg Oral QHS Jayne Harlene CROME, CNM   16 mg at 11/23/24 2211   Followed by   NOREEN ON 11/26/2024] methylPREDNISolone  (MEDROL ) tablet 8 mg  8 mg Oral QHS Jayne Harlene CROME, CNM       Followed by   NOREEN ON 11/29/2024] methylPREDNISolone  (MEDROL ) tablet 4 mg  4 mg Oral QHS Jayne Harlene CROME, CNM       metoCLOPramide  (REGLAN ) tablet 10 mg  10 mg Oral Q6H Sebastian Sham, CNM   10 mg at 11/24/24 1354   Or   metoCLOPramide  (REGLAN ) injection 10 mg  10 mg Intravenous Q6H Sebastian Sham, CNM   10 mg at 11/23/24 9185   ondansetron  (ZOFRAN -ODT) disintegrating tablet 4 mg  4 mg Oral Q6H Jayne Harlene CROME, CNM   4 mg at 11/24/24 1016   Or   ondansetron  (ZOFRAN ) injection 4 mg  4 mg Intravenous Q6H Jayne Harlene L, CNM   4 mg at 11/23/24 1003   Or   ondansetron  (ZOFRAN ) 8 mg in sodium chloride  0.9 % 50 mL IVPB  8 mg Intravenous Q6H Jayne Harlene L, CNM       promethazine  (PHENERGAN ) tablet 12.5-25 mg  12.5-25 mg Oral Q4H PRN Albrecht, Jessica L, CNM   25 mg at 11/23/24 1814   Or   promethazine  (PHENERGAN ) suppository 12.5-25 mg  12.5-25 mg Rectal Q4H  PRN Jayne Harlene CROME, CNM  scopolamine  (TRANSDERM-SCOP) 1 MG/3DAYS 1 mg  1 patch Transdermal Q72H Jayne Harlene CROME, CNM   1 mg at 11/22/24 1527   zolpidem  (AMBIEN ) tablet 5 mg  5 mg Oral QHS PRN Sebastian Sham, CNM        Labs:  Recent Labs  Lab 11/19/24 1451 11/20/24 1430  WBC 4.6 4.5  HGB 11.6* 11.3*  HCT 35.0* 34.5*  PLT 236 226    Recent Labs  Lab 11/19/24 1451 11/20/24 1430 11/21/24 0533 11/22/24 0910 11/23/24 0733 11/24/24 0617 11/24/24 0618  NA 130* 133* 134* 135 138  --  135  K 3.4* 2.9* 3.3* 3.0* 3.0*  --  3.8  CL 95* 98 101 103 105  --  103  CO2 19* 21* 20* 24 24  --  23  BUN 7 6 <5* <5* <5*  --  <5*  CREATININE 0.64 0.63 0.64 0.62 0.59  --  0.60  CALCIUM  9.7 9.8 9.1 9.3 9.1  --  9.0  PROT 8.0 8.1 6.7  --   --   --   --   BILITOT 0.9 0.9 1.0  --   --   --   --   ALKPHOS 77 73 60  --   --   --   --   ALT 25 20 17   --   --   --   --   AST 24 24 20   --   --   --   --   GLUCOSE 78 93 75 94 89 95 92       Assessment & Plan:  -Continue current hyperemesis protocol with po meds. -Diet: Advance to bland today -Medlock IV and encourage po fluids in anticipation of discharge home tomorrow. -Electrolyte replacement per pharmacy -Strict I&O and daily weights  -Lauraine Lakes, CNM

## 2024-11-25 LAB — RENAL FUNCTION PANEL
Albumin: 3.3 g/dL — ABNORMAL LOW (ref 3.5–5.0)
Anion gap: 8 (ref 5–15)
BUN: <5 mg/dL — ABNORMAL LOW (ref 6–20)
CO2: 23 mmol/L (ref 22–32)
Calcium: 9.1 mg/dL (ref 8.9–10.3)
Chloride: 103 mmol/L (ref 98–111)
Creatinine, Ser: 0.57 mg/dL (ref 0.44–1.00)
GFR, Estimated: >60 mL/min
Glucose, Bld: 99 mg/dL (ref 70–99)
Phosphorus: 3.7 mg/dL (ref 2.5–4.6)
Potassium: 3.8 mmol/L (ref 3.5–5.1)
Sodium: 135 mmol/L (ref 135–145)

## 2024-11-25 LAB — MAGNESIUM: Magnesium: 1.9 mg/dL (ref 1.7–2.4)

## 2024-11-25 MED ORDER — METHYLPREDNISOLONE SODIUM SUCC 40 MG IJ SOLR
16.0000 mg | Freq: Once | INTRAMUSCULAR | Status: AC
  Administered 2024-11-25: 16 mg via INTRAVENOUS
  Filled 2024-11-25: qty 1

## 2024-11-25 NOTE — Consult Note (Signed)
 PHARMACY CONSULT NOTE - FOLLOW UP  Pharmacy Consult for Electrolyte Monitoring and Replacement   Recent Labs: Potassium (mmol/L)  Date Value  11/25/2024 3.8   Magnesium  (mg/dL)  Date Value  98/80/7973 1.9   Calcium  (mg/dL)  Date Value  98/80/7973 9.1   Albumin (g/dL)  Date Value  98/80/7973 3.3 (L)  11/03/2023 3.9 (L)   Phosphorus (mg/dL)  Date Value  98/80/7973 3.7   Sodium (mmol/L)  Date Value  11/25/2024 135  11/03/2023 136     Assessment: 29 y.o. G3P0010 at [redacted]w[redacted]d admitted for hyperemesis.  She has had several days of vomiting that has worsened. Currently NPO.   Goal of Therapy:  Electrolytes WNL  Plan:  Continue LR with Kcl 20 mEq @ 125 ml/hr until end time.  F/u with AM labs.   Kayla JULIANNA Blew ,PharmD Clinical Pharmacist 11/25/2024 9:23 AM

## 2024-11-25 NOTE — Progress Notes (Signed)
 Hospital day # 3 pregnancy at [redacted]w[redacted]d--hyperemesis.  S:  Perception of contractions: none      Vaginal bleeding: none        Vaginal discharge:  no significant change  O: BP 115/75 (BP Location: Left Arm)   Pulse 81   Temp 98.3 F (36.8 C) (Oral)   Resp 19   Ht 5' 7 (1.702 m)   Wt 112.4 kg   LMP 09/09/2023   SpO2 100%   BMI 38.81 kg/m       Fetal tracings:n/a      Contractions:  none       Uterus gravid and non-tender      Extremities: extremities normal, atraumatic, no cyanosis or edema and no significant edema and no signs of DVT     Physical exam: General: Well nourished, well developed female in no acute distress. Abdomen:soft, non-tender Cardiovascular: normal rate Respiratory:  normal respiratory effort Skin: Warm and dry      Labs:   Results for orders placed or performed during the hospital encounter of 11/20/24 (from the past 24 hours)  Magnesium      Status: None   Collection Time: 11/25/24  5:00 AM  Result Value Ref Range   Magnesium  1.9 1.7 - 2.4 mg/dL  Renal function panel     Status: Abnormal   Collection Time: 11/25/24  7:30 AM  Result Value Ref Range   Sodium 135 135 - 145 mmol/L   Potassium 3.8 3.5 - 5.1 mmol/L   Chloride 103 98 - 111 mmol/L   CO2 23 22 - 32 mmol/L   Glucose, Bld 99 70 - 99 mg/dL   BUN <5 (L) 6 - 20 mg/dL   Creatinine, Ser 9.42 0.44 - 1.00 mg/dL   Calcium  9.1 8.9 - 10.3 mg/dL   Phosphorus 3.7 2.5 - 4.6 mg/dL   Albumin 3.3 (L) 3.5 - 5.0 g/dL   GFR, Estimated >39 >39 mL/min   Anion gap 8 5 - 15         Meds: Current Medications[1]  A: [redacted]w[redacted]d with hyperemesis: stable     Fetal tracings: n/a      Uterus non-tender      Extremities: DTR 1+, no clonus, no edema     Magnesium  sulfate  n/a  P: Continue current plan of care, possible discharge this evening if able to tolerate bland diet. Vomited x 1 yesterday. She continues to take PO liquids and medications without emesis.       Upcoming tests/treatments:  none       Consult with  Dr.  Starla for plan on care       Zelda Hummer, CNM  11/25/2024. 8:41 AM     [1]  Current Facility-Administered Medications:    acetaminophen  (TYLENOL ) tablet 650 mg, 650 mg, Oral, Q4H PRN, Hummer Zelda, CNM, 650 mg at 11/20/24 1639   calcium  carbonate (TUMS - dosed in mg elemental calcium ) chewable tablet 400 mg of elemental calcium , 2 tablet, Oral, Q4H PRN, Hummer Zelda, CNM, 400 mg of elemental calcium  at 11/23/24 1814   docusate sodium  (COLACE) capsule 100 mg, 100 mg, Oral, Daily, Hummer Zelda, CNM, 100 mg at 11/24/24 1016   pyridOXINE  (VITAMIN B6) tablet 25 mg, 25 mg, Oral, BID, 25 mg at 11/24/24 2212 **AND** doxylamine  (Sleep) (UNISOM ) tablet 25 mg, 25 mg, Oral, BID, Albrecht, Jessica L, CNM, 25 mg at 11/24/24 2212   famotidine  (PEPCID ) tablet 20 mg, 20 mg, Oral, Q12H, 20 mg at 11/24/24 2211 **OR** famotidine  (PEPCID ) IVPB  20 mg premix, 20 mg, Intravenous, Q12H, Sebastian Sham, CNM, Stopped at 11/23/24 1032   hydrOXYzine  (ATARAX ) tablet 50 mg, 50 mg, Oral, Q6H PRN **OR** hydrOXYzine  (VISTARIL ) injection 50 mg, 50 mg, Intramuscular, Q6H PRN, Albrecht, Jessica L, CNM   lactated ringers  1,000 mL with potassium chloride  20 mEq infusion, , Intravenous, Continuous, Charma Domino, CNM, Last Rate: 50 mL/hr at 11/25/24 0812, Rate Change at 11/25/24 9187   methylPREDNISolone  (MEDROL ) tablet 16 mg, 16 mg, Oral, Q breakfast, 16 mg at 11/24/24 0838 **FOLLOWED BY** [START ON 11/27/2024] methylPREDNISolone  (MEDROL ) tablet 8 mg, 8 mg, Oral, Q breakfast **FOLLOWED BY** [START ON 12/04/2024] methylPREDNISolone  (MEDROL ) tablet 4 mg, 4 mg, Oral, Q breakfast, Jayne, Harlene CROME, CNM   methylPREDNISolone  (MEDROL ) tablet 16 mg, 16 mg, Oral, QHS, 16 mg at 11/24/24 2214 **FOLLOWED BY** [START ON 11/26/2024] methylPREDNISolone  (MEDROL ) tablet 8 mg, 8 mg, Oral, QHS **FOLLOWED BY** [START ON 11/29/2024] methylPREDNISolone  (MEDROL ) tablet 4 mg, 4 mg, Oral, QHS, Albrecht, Jessica L, CNM   [COMPLETED]  methylPREDNISolone  (MEDROL ) tablet 16 mg, 16 mg, Oral, Q1400, 16 mg at 11/24/24 1423 **FOLLOWED BY** methylPREDNISolone  (MEDROL ) tablet 8 mg, 8 mg, Oral, Q1400 **FOLLOWED BY** [START ON 11/28/2024] methylPREDNISolone  (MEDROL ) tablet 4 mg, 4 mg, Oral, Q1400, Jayne Harlene CROME, CNM   metoCLOPramide  (REGLAN ) tablet 10 mg, 10 mg, Oral, Q6H, 10 mg at 11/25/24 0814 **OR** metoCLOPramide  (REGLAN ) injection 10 mg, 10 mg, Intravenous, Q6H, Sebastian Sham, CNM, 10 mg at 11/23/24 9185   ondansetron  (ZOFRAN -ODT) disintegrating tablet 4 mg, 4 mg, Oral, Q6H, 4 mg at 11/25/24 0442 **OR** ondansetron  (ZOFRAN ) injection 4 mg, 4 mg, Intravenous, Q6H, 4 mg at 11/23/24 1003 **OR** ondansetron  (ZOFRAN ) 8 mg in sodium chloride  0.9 % 50 mL IVPB, 8 mg, Intravenous, Q6H, Jayne, Jessica L, CNM   promethazine  (PHENERGAN ) tablet 12.5-25 mg, 12.5-25 mg, Oral, Q4H PRN, 25 mg at 11/24/24 1810 **OR** promethazine  (PHENERGAN ) suppository 12.5-25 mg, 12.5-25 mg, Rectal, Q4H PRN, Jayne Harlene CROME, CNM   scopolamine  (TRANSDERM-SCOP) 1 MG/3DAYS 1 mg, 1 patch, Transdermal, Q72H, Jayne Harlene CROME, CNM, 1 mg at 11/22/24 1527   zolpidem  (AMBIEN ) tablet 5 mg, 5 mg, Oral, QHS PRN, Sebastian Sham, CNM

## 2024-11-26 LAB — BASIC METABOLIC PANEL WITH GFR
Anion gap: 8 (ref 5–15)
BUN: <5 mg/dL — ABNORMAL LOW (ref 6–20)
CO2: 25 mmol/L (ref 22–32)
Calcium: 9.1 mg/dL (ref 8.9–10.3)
Chloride: 103 mmol/L (ref 98–111)
Creatinine, Ser: 0.61 mg/dL (ref 0.44–1.00)
GFR, Estimated: >60 mL/min
Glucose, Bld: 103 mg/dL — ABNORMAL HIGH (ref 70–99)
Potassium: 3.9 mmol/L (ref 3.5–5.1)
Sodium: 136 mmol/L (ref 135–145)

## 2024-11-26 LAB — GLUCOSE, CAPILLARY: Glucose-Capillary: 118 mg/dL — ABNORMAL HIGH (ref 70–99)

## 2024-11-26 LAB — PHOSPHORUS: Phosphorus: 4.1 mg/dL (ref 2.5–4.6)

## 2024-11-26 LAB — MAGNESIUM: Magnesium: 1.8 mg/dL (ref 1.7–2.4)

## 2024-11-26 MED ORDER — ENSURE PLUS HIGH PROTEIN PO LIQD
237.0000 mL | Freq: Three times a day (TID) | ORAL | Status: DC
  Administered 2024-11-26 – 2024-11-27 (×2): 237 mL via ORAL

## 2024-11-26 MED ORDER — COMPLETENATE 29-1 MG PO CHEW
1.0000 | CHEWABLE_TABLET | Freq: Every day | ORAL | Status: DC
  Administered 2024-11-26 – 2024-11-27 (×2): 1 via ORAL
  Filled 2024-11-26 (×2): qty 1

## 2024-11-26 MED ORDER — ENSURE PLUS HIGH PROTEIN PO LIQD
237.0000 mL | Freq: Two times a day (BID) | ORAL | Status: DC
  Administered 2024-11-26: 237 mL via ORAL

## 2024-11-26 NOTE — Consult Note (Signed)
 PHARMACY CONSULT NOTE - FOLLOW UP  Pharmacy Consult for Electrolyte Monitoring and Replacement   Recent Labs: Potassium (mmol/L)  Date Value  11/26/2024 3.9   Magnesium  (mg/dL)  Date Value  98/79/7973 1.8   Calcium  (mg/dL)  Date Value  98/79/7973 9.1   Albumin (g/dL)  Date Value  98/80/7973 3.3 (L)  11/03/2023 3.9 (L)   Phosphorus (mg/dL)  Date Value  98/79/7973 4.1   Sodium (mmol/L)  Date Value  11/26/2024 136  11/03/2023 136   Assessment: 29 y.o. G3P0010 at [redacted]w[redacted]d admitted for hyperemesis.  She has had several days of vomiting that has worsened. Currently NPO.   Goal of Therapy:  Electrolytes WNL  Plan:  K 3.9 - no replacement indicated for today. F/u with AM labs.   Thank you for involving pharmacy in this patient's care.   Damien Napoleon, PharmD Clinical Pharmacist 11/26/2024 7:35 AM

## 2024-11-26 NOTE — Discharge Instructions (Signed)
 Morning Sickness Nutrition Therapy  This nutrition therapy will help you with the nausea and vomiting experienced during pregnancy. Everyones body is different. You may want to try the following tips to see if they work for you: Try to eat 6 small meals/snacks during the day. Small meals may be easier to tolerate than large meals. Keep easy to digest foods, such as crackers and pretzels, with you during the day and at your bedside. You may even try eating a few crackers before getting out of bed in the morning. Drink water or other beverages (caffeine-free) between meals. Eating ginger may improve nausea. Lower-fat foods are easier to digest. High-fat foods can make nausea worse.  Foods Recommended You may eat any foods except those on the Foods Not Recommended list. Eat when you feel hungry. Foods without much smell may be easier for you to tolerate. The following foods may be easier to eat when you feel nauseous: Cold foods such as ice cream, smoothies, popsicles, or frozen fruit Warm foods such as mashed or baked potatoes, soups, or toast Spicy foods such as salsa, gingersnaps, gingerbread, or curries Tart/sour foods such as tomato or vegetable juice, dill pickles, lemons/lemonade, limes/limeade, or other citrus fruits and juices Creamy foods such as low-fat milk, custards, puddings, or yogurt Crunchy foods such as raw vegetables (particularly carrots and celery), pretzels, raw fruits (particularly apples or pears), nuts, crackers, or dry cereal Soft foods such as cake, cottage cheese, cooked carrots, or green beans Beverages and liquid foods such as fruit juice, water, gelatin, smoothies, or broth Salty foods such as chips, salted top crackers, dip, pizza, or tomato or vegetable juice Foods made with chocolate such as chocolate milk, pudding, or ice cream, or fudge sauce for dipping fruit or crackers.  Foods Not Recommended Beverages Alcohol Excessive caffeine Some herbal teas: avoid  drinking herbal teas. If you must, speak with your doctor before drinking. Unpasteurized cider and juices Meat and Poultry Raw or uncooked meats, fish, poultry, or eggs Foods high in mercury: Shark Swordfish King mackerel Tilefish Limit all other fish (including tuna) to 12 ounces  or less per week Hot dogs, luncheon meats, bologna, or other deli meats unless they are heated until steaming hot Smoked fish  Dairy Products Raw or unpasteurized milk; cheese and dairy products made with raw or unpasteurized milk Soft serve yogurt Soft cheese like brie Blue cheese like gorgonzola Fruits and Vegetables Raw sprouts Other Items to Avoid Tobacco Illicit drugs Herbal remedies or supplements. Discuss any teas, herbs, or home remedies you do use with your doctor to be sure they are safe for you and your baby. Vitamin or mineral supplements other than those recommended or prescribed by your doctor, nurse practitioner, or midwife.  Morning Sickness Sample 1-Day Menu  Breakfast 1 cup cooked oatmeal 1/4 cup sliced unsalted almonds 1/2 cup sliced strawberries 1 teaspoon margarine-substitute 1 cup low-fat milk 1.5 cups seltzer water or ginger tea  Morning Snack 8 saltine crackers 1.5 oz cheddar cheese  Lunch 3/4 cup low-fat cottage cheese 1 cup cubed cantaloupe 6 carrot sticks 1.5 cups seltzer water or ginger tea  Afternoon Snack 1 oz pretzels 1 tablespoon peanut butter  Evening Meal 3 oz cooked skinless chicken 1 medium baked potato 1/2 cup cooked carrots 1/2 cup chocolate pudding 1/2 cup 100% fruit juice  Evening Snack 1 slice whole wheat bread 2 tablespoons low-fat cream cheese 1/2 cup aliced cucumber 1/2 cup sliced tomatoes 1.5 cups plain or seltzer water  Morning Sickness Vegan Sample 1-Day Menu  Breakfast 1 cup cooked oatmeal  cup almonds 1 cup strawberries 1 cup soymilk fortified with calcium , vitamin B12, and vitamin D 1.5 cups seltzer water or ginger tea  Morning  Snack 10 saltine crackers  cup hummus  cup apple juice  Lunch  cup white beans 1 cup cantaloupe 6 carrot sticks 2 tablespoons salsa 1.5 cups seltzer water or ginger tea  Afternoon Snack 1 ounce pretzels 1 tablespoon peanut butter  Evening Meal 1 black bean burger 1 whole wheat bun 2 slices tomato 4 slices pickles 1 teaspoon mustard  cup mashed potatoes  cup green beans 2 teaspoons olive oil  Evening Snack 6 ounces soy yogurt 1.5 cups water     Morning Sickness Vegetarian (Lacto-Ovo) Sample 1-Day Menu  Breakfast 1 cup cooked oatmeal  cup almonds 1 cup strawberries 1 cup 1% milk 1.5 cups seltzer water or ginger tea  Morning Snack 10 saltine crackers  cup hummus  cup apple juice  Lunch  cup low-fat cottage cheese 1 cup cantaloupe 6 carrot sticks 1.5 cups seltzer water or ginger tea  Afternoon Snack 1 ounce pretzels 1 tablespoon peanut butter  Evening Meal 1 black bean burger 1 whole wheat bun 2 slices tomato 4 slices pickles 1 teaspoon mustard  cup mashed potatoes  cup green beans 2 teaspoons olive oil  Evening Snack 8 ounces low-fat yogurt 1.5 cups water

## 2024-11-26 NOTE — Progress Notes (Signed)
 Daily Antepartum Note  Admission Date: 11/20/2024 Current Date: 11/26/2024 11:09 AM  Amanda Summers is a 29 y.o. G3P0010 at [redacted]w[redacted]d by LMP & US , HD#7, admitted for hyperemesis.  Pregnancy complicated by:  Patient Active Problem List   Diagnosis Date Noted   Ptyalism 11/22/2024   Hyperemesis affecting pregnancy, antepartum 11/22/2024   Supervision of other normal pregnancy, antepartum 10/25/2024   Anemia 10/30/2023   Recurrent major depressive disorder 10/30/2023   Attention deficit hyperactivity disorder (ADHD) 10/30/2023   Ruptured right tubal ectopic pregnancy causing hemoperitoneum 05/10/2022   Obesity, morbid (HCC) 04/08/2021    Overnight/24hr events:  Has not vomited since yesterday morning.   Subjective:  Amanda Summers is starting to improve. She tolerated applesauce yesterday and oatmeal this AM. She is taking all meds PO. She is having some soreness in her upper abdomen that she attributes to vomiting.  Objective:   Vitals:   11/25/24 2322 11/26/24 0801  BP: 106/76 109/63  Pulse: 73 84  Resp: 18 18  Temp: 98.4 F (36.9 C) 98.4 F (36.9 C)  SpO2: 100% 97%   Temp:  [98.2 F (36.8 C)-98.5 F (36.9 C)] 98.4 F (36.9 C) (01/20 0801) Pulse Rate:  [67-90] 84 (01/20 0801) Resp:  [18-20] 18 (01/20 0801) BP: (106-136)/(63-76) 109/63 (01/20 0801) SpO2:  [97 %-100 %] 97 % (01/20 0801) Weight:  [111.2 kg] 111.2 kg (01/20 0800) Temp (24hrs), Avg:98.4 F (36.9 C), Min:98.2 F (36.8 C), Max:98.5 F (36.9 C)   Intake/Output Summary (Last 24 hours) at 11/26/2024 1109 Last data filed at 11/26/2024 0615 Gross per 24 hour  Intake 1513.77 ml  Output 3000 ml  Net -1486.23 ml     Current Vital Signs 24h Vital Sign Ranges  T 98.4 F (36.9 C) Temp  Avg: 98.4 F (36.9 C)  Min: 98.2 F (36.8 C)  Max: 98.5 F (36.9 C)  BP 109/63 BP  Min: 106/76  Max: 136/63  HR 84 Pulse  Avg: 80.4  Min: 67  Max: 90  RR 18 Resp  Avg: 18.4  Min: 18  Max: 20  SaO2 97 % Room Air SpO2  Avg: 99 %  Min:  97 %  Max: 100 %       24 Hour I/O Current Shift I/O  Time Ins Outs 01/19 0701 - 01/20 0700 In: 1513.8 [P.O.:240; I.V.:1240.1] Out: 3600 [Urine:3600] No intake/output data recorded.   Patient Vitals for the past 24 hrs:  BP Temp Temp src Pulse Resp SpO2 Weight  11/26/24 0801 109/63 98.4 F (36.9 C) Oral 84 18 97 % --  11/26/24 0800 -- -- -- -- -- -- 111.2 kg  11/25/24 2322 106/76 98.4 F (36.9 C) Oral 73 18 100 % --  11/25/24 1944 136/63 98.4 F (36.9 C) Oral 90 18 100 % --  11/25/24 1503 112/70 98.5 F (36.9 C) Oral 67 18 -- --  11/25/24 1113 124/68 98.2 F (36.8 C) Oral 88 20 -- --    Physical exam: General: Well nourished, well developed female in no acute distress. Abdomen: gravid, non-tender, normal bowel sounds Cardiovascular: regular rate and rhythm Respiratory: CTAB Extremities: no clubbing, cyanosis or edema Skin: Warm and dry.   Medications: Current Facility-Administered Medications  Medication Dose Route Frequency Provider Last Rate Last Admin   acetaminophen  (TYLENOL ) tablet 650 mg  650 mg Oral Q4H PRN Thompson, Annie, CNM   650 mg at 11/20/24 1639   calcium  carbonate (TUMS - dosed in mg elemental calcium ) chewable tablet 400 mg of elemental calcium   2 tablet Oral Q4H PRN Sebastian Sham, CNM   400 mg of elemental calcium  at 11/23/24 1814   docusate sodium  (COLACE) capsule 100 mg  100 mg Oral Daily Sebastian Sham, CNM   100 mg at 11/26/24 1012   pyridOXINE  (VITAMIN B6) tablet 25 mg  25 mg Oral BID Jayne Harlene CROME, CNM   25 mg at 11/26/24 1012   And   doxylamine  (Sleep) (UNISOM ) tablet 25 mg  25 mg Oral BID Albrecht, Jessica L, CNM   25 mg at 11/26/24 1012   famotidine  (PEPCID ) tablet 20 mg  20 mg Oral Q12H Sebastian Sham, CNM   20 mg at 11/26/24 9081   Or   famotidine  (PEPCID ) IVPB 20 mg premix  20 mg Intravenous Q12H Sebastian Sham, CNM 100 mL/hr at 11/25/24 1139 20 mg at 11/25/24 1139   feeding supplement (ENSURE PLUS HIGH PROTEIN) liquid 237 mL  237  mL Oral BID BM Paloma Grange M, CNM   237 mL at 11/26/24 1013   hydrOXYzine  (ATARAX ) tablet 50 mg  50 mg Oral Q6H PRN Jayne Harlene CROME, CNM       Or   hydrOXYzine  (VISTARIL ) injection 50 mg  50 mg Intramuscular Q6H PRN Jayne Harlene CROME, CNM       methylPREDNISolone  (MEDROL ) tablet 16 mg  16 mg Oral Q breakfast Jayne Harlene CROME, CNM   16 mg at 11/26/24 9080   Followed by   NOREEN ON 11/27/2024] methylPREDNISolone  (MEDROL ) tablet 8 mg  8 mg Oral Q breakfast Jayne Harlene CROME, CNM       Followed by   NOREEN ON 12/04/2024] methylPREDNISolone  (MEDROL ) tablet 4 mg  4 mg Oral Q breakfast Jayne Harlene CROME, CNM       methylPREDNISolone  (MEDROL ) tablet 8 mg  8 mg Oral Q1400 Jayne Harlene CROME, CNM       Followed by   NOREEN ON 11/28/2024] methylPREDNISolone  (MEDROL ) tablet 4 mg  4 mg Oral Q1400 Jayne Harlene CROME, CNM       methylPREDNISolone  (MEDROL ) tablet 8 mg  8 mg Oral QHS Jayne Harlene CROME, CNM       Followed by   NOREEN ON 11/29/2024] methylPREDNISolone  (MEDROL ) tablet 4 mg  4 mg Oral QHS Jayne Harlene CROME, CNM       metoCLOPramide  (REGLAN ) tablet 10 mg  10 mg Oral Q6H Sebastian Sham, CNM   10 mg at 11/26/24 9197   Or   metoCLOPramide  (REGLAN ) injection 10 mg  10 mg Intravenous Q6H Sebastian Sham, CNM   10 mg at 11/25/24 1425   ondansetron  (ZOFRAN -ODT) disintegrating tablet 4 mg  4 mg Oral Q6H Jayne Harlene CROME, CNM   4 mg at 11/26/24 9081   Or   ondansetron  (ZOFRAN ) injection 4 mg  4 mg Intravenous Q6H Jayne Harlene CROME, CNM   4 mg at 11/25/24 2130   Or   ondansetron  (ZOFRAN ) 8 mg in sodium chloride  0.9 % 50 mL IVPB  8 mg Intravenous Q6H Jayne Harlene L, CNM       promethazine  (PHENERGAN ) tablet 12.5-25 mg  12.5-25 mg Oral Q4H PRN Albrecht, Jessica L, CNM   25 mg at 11/24/24 1810   Or   promethazine  (PHENERGAN ) suppository 12.5-25 mg  12.5-25 mg Rectal Q4H PRN Jayne Harlene CROME, CNM   12.5 mg at 11/25/24 1223   scopolamine  (TRANSDERM-SCOP) 1 MG/3DAYS 1 mg  1  patch Transdermal Q72H Jayne Harlene CROME, CNM   1 mg at 11/25/24 1644   zolpidem  (AMBIEN ) tablet 5  mg  5 mg Oral QHS PRN Sebastian Sham, CNM        Labs:  Recent Labs  Lab 11/19/24 1451 11/20/24 1430  WBC 4.6 4.5  HGB 11.6* 11.3*  HCT 35.0* 34.5*  PLT 236 226    Recent Labs  Lab 11/19/24 1451 11/20/24 1430 11/21/24 0533 11/22/24 0910 11/24/24 0618 11/25/24 0730 11/26/24 0518  NA 130* 133* 134*   < > 135 135 136  K 3.4* 2.9* 3.3*   < > 3.8 3.8 3.9  CL 95* 98 101   < > 103 103 103  CO2 19* 21* 20*   < > 23 23 25   BUN 7 6 <5*   < > <5* <5* <5*  CREATININE 0.64 0.63 0.64   < > 0.60 0.57 0.61  CALCIUM  9.7 9.8 9.1   < > 9.0 9.1 9.1  PROT 8.0 8.1 6.7  --   --   --   --   BILITOT 0.9 0.9 1.0  --   --   --   --   ALKPHOS 77 73 60  --   --   --   --   ALT 25 20 17   --   --   --   --   AST 24 24 20   --   --   --   --   GLUCOSE 78 93 75   < > 92 99 103*   < > = values in this interval not displayed.      Assessment & Plan:  -Continue to advance diet as tolerated. Ensure ordered -Nutrition consult  -Continue PO medication regiment -Discussed plan for outpatient management. Anticipate discharge tomorrow if able to tolerate bland diet  Eleanor CHRISTELLA Canny, CNM

## 2024-11-26 NOTE — Progress Notes (Signed)
 Initial Nutrition Assessment  DOCUMENTATION CODES:   Obesity unspecified  INTERVENTION:   -Prenatal MVI daily -Ensure Plus High Protein po TID, each supplement provides 350 kcal and 20 grams of protein  -Magic cup TID with meals, each supplement provides 290 kcal and 9 grams of protein  -Continue soft diet -RD provided Morning Sickness Nutrition Therapy handout from [facility] Nutrition Care Manual; attached to AVS/ discharge summary   NUTRITION DIAGNOSIS:   Inadequate oral intake related to nausea, vomiting as evidenced by per patient/family report.  GOAL:   Patient will meet greater than or equal to 90% of their needs  MONITOR:   PO intake, Supplement acceptance  REASON FOR ASSESSMENT:   Consult Poor PO  ASSESSMENT:   29 y.o. G3P0010 at [redacted]w[redacted]d admitted for hyperemesis.  She has had several days of vomiting that has worsened.  Patient admitted with hyperemesis.   11/29- positive pregnancy test 1/4- ED visit for hyperemesis, prescribed reglan  1/9- continued nausea and vomiting, using vitamin B6/ unisom / ondansetron  with no improvement, promethazine  ordered 1/13- ED visit for hyperemesis, vomited about 10 times after discharge (both when and when not drinking fluids) 1/14- NPO 1/15- advanced to full, advanced to regular diet 1/16- NPO 1/17- advanced to clear liquid diet 1/18- advanced to soft diet 1/19- advanced to clear liquid diet 1/20- advanced to soft diet   Reviewed I/O's: -2.1 L x 24 hours and -2.5 L since admission  UOP: 3.6 L x 24 hours  Case discussed with RN prior to visit, who reports patient did not eat anything yesterday. This morning she consumed oatmeal without difficulty. She was given Ensure, but did not drink it yet.   Chart reviewed. Noted patient with multiple admissions for hyperemesis over the past 1-2 months.   Spoke with patient at bedside, who reports feeling a little better today. Observed breakfast tray at bedside. She consumed 100% of  oatmeal off tray. Noted Ensure supplement at bedside, which was opened, but patient reports she has not tried yet.   Patient able to confirm above events; she shares that she has been struggling with hyperemesis symptoms since week 6 in pregnancy, with inability to keep foods and liquids down to to nausea and vomiting. Per patient, symptoms are currently managed with medications. She was able to tolerate oatmeal without difficulty. She reports she has not tried Ensure yet, but feel apprehensive to drink how as  she does not want to rush it, but reports she will try in a little while. RD reviewed formulary and offered alterative supplements, but would like to stick with Ensure for now.   Patient shares her pre-gravid weight is 261#. She admits that she was taking Zepbound  prior to pregnancy and weight often fluctuated on this medication. She shares a 20# weight loss over the past 1-2 months due to hyperemesis symptoms. Reviewed weight history; patient has experienced a 6.4% weight loss over the past month, which is significant for time frame. Suspect some weight loss may be related to dehydration secondary to hyperemesis symptoms.   Discussed importance of good meal and supplement intake to promote healing. Patient amenable to supplements.   Medications reviewed and include colace, vitamin B-6, pepcid , methylprednisolone , reglan , and zofran .   Labs reviewed: K, Mg, and Phos WDL.    NUTRITION - FOCUSED PHYSICAL EXAM:  Flowsheet Row Most Recent Value  Orbital Region No depletion  Upper Arm Region No depletion  Thoracic and Lumbar Region No depletion  Buccal Region No depletion  Temple Region No depletion  Clavicle Bone Region No depletion  Clavicle and Acromion Bone Region No depletion  Scapular Bone Region No depletion  Dorsal Hand No depletion  Patellar Region No depletion  Anterior Thigh Region No depletion  Posterior Calf Region No depletion  Edema (RD Assessment) None  Hair Reviewed   Eyes Reviewed  Mouth Reviewed  Skin Reviewed  Nails Reviewed    Diet Order:   Diet Order             DIET SOFT Room service appropriate? Yes; Fluid consistency: Thin  Diet effective now                   EDUCATION NEEDS:   Education needs have been addressed  Skin:  Skin Assessment: Reviewed RN Assessment  Last BM:  Unknown  Height:   Ht Readings from Last 1 Encounters:  11/20/24 5' 7 (1.702 m)    Weight:   Wt Readings from Last 1 Encounters:  11/26/24 111.2 kg    Ideal Body Weight:  61.4 kg  BMI:  Body mass index is 38.4 kg/m.  Estimated Nutritional Needs:   Kcal:  2200-2400  Protein:  105-120 grams  Fluid:  2.0-2.2 L    Margery ORN, RD, LDN, CDCES Registered Dietitian III Certified Diabetes Care and Education Specialist If unable to reach this RD, please use RD Inpatient group chat on secure chat between hours of 8am-4 pm daily

## 2024-11-27 ENCOUNTER — Other Ambulatory Visit: Payer: Self-pay

## 2024-11-27 LAB — BASIC METABOLIC PANEL WITH GFR
Anion gap: 9 (ref 5–15)
BUN: <5 mg/dL — ABNORMAL LOW (ref 6–20)
CO2: 26 mmol/L (ref 22–32)
Calcium: 9.5 mg/dL (ref 8.9–10.3)
Chloride: 99 mmol/L (ref 98–111)
Creatinine, Ser: 0.58 mg/dL (ref 0.44–1.00)
GFR, Estimated: >60 mL/min
Glucose, Bld: 93 mg/dL (ref 70–99)
Potassium: 3.6 mmol/L (ref 3.5–5.1)
Sodium: 134 mmol/L — ABNORMAL LOW (ref 135–145)

## 2024-11-27 MED ORDER — PROMETHAZINE HCL 25 MG RE SUPP: 25.0000 mg | Freq: Four times a day (QID) | RECTAL | 1 refills | Status: AC | PRN

## 2024-11-27 MED ORDER — PROMETHAZINE HCL 12.5 MG RE SUPP
12.5000 mg | RECTAL | 0 refills | Status: AC | PRN
  Filled 2024-11-27: qty 12, 1d supply, fill #0

## 2024-11-27 MED ORDER — SCOPOLAMINE 1 MG/3DAYS TD PT72
1.0000 | MEDICATED_PATCH | TRANSDERMAL | 12 refills | Status: AC
  Filled 2024-11-27: qty 10, 30d supply, fill #0

## 2024-11-27 MED ORDER — ONDANSETRON 4 MG PO TBDP
4.0000 mg | ORAL_TABLET | Freq: Four times a day (QID) | ORAL | 4 refills | Status: AC
  Filled 2024-11-27: qty 30, 8d supply, fill #0

## 2024-11-27 MED ORDER — METHYLPREDNISOLONE 4 MG PO TABS
ORAL_TABLET | ORAL | 0 refills | Status: AC
  Filled 2024-11-27: qty 14, 8d supply, fill #0

## 2024-11-27 MED ORDER — METOCLOPRAMIDE HCL 10 MG PO TABS
10.0000 mg | ORAL_TABLET | Freq: Four times a day (QID) | ORAL | 3 refills | Status: AC
  Filled 2024-11-27: qty 120, 30d supply, fill #0

## 2024-11-27 MED ORDER — ENSURE PLUS HIGH PROTEIN PO LIQD: 237.0000 mL | Freq: Three times a day (TID) | ORAL | Status: AC

## 2024-11-27 MED ORDER — DOXYLAMINE SUCCINATE (SLEEP) 25 MG PO TABS
25.0000 mg | ORAL_TABLET | Freq: Two times a day (BID) | ORAL | 0 refills | Status: AC
  Filled 2024-11-27: qty 30, 15d supply, fill #0

## 2024-11-27 MED ORDER — ACETAMINOPHEN 325 MG PO TABS: 650.0000 mg | ORAL_TABLET | ORAL | Status: AC | PRN

## 2024-11-27 MED ORDER — PROMETHAZINE HCL 25 MG PO TABS
12.5000 mg | ORAL_TABLET | ORAL | 0 refills | Status: AC | PRN
  Filled 2024-11-27: qty 15, 3d supply, fill #0

## 2024-11-27 MED ORDER — DOCUSATE SODIUM 100 MG PO CAPS
100.0000 mg | ORAL_CAPSULE | Freq: Every day | ORAL | 0 refills | Status: AC
  Filled 2024-11-27: qty 10, 10d supply, fill #0

## 2024-11-27 MED ORDER — PYRIDOXINE HCL 25 MG PO TABS
25.0000 mg | ORAL_TABLET | Freq: Two times a day (BID) | ORAL | 3 refills | Status: AC
  Filled 2024-11-27: qty 60, 30d supply, fill #0

## 2024-11-27 MED ORDER — CALCIUM CARBONATE ANTACID 500 MG PO CHEW: 2.0000 | CHEWABLE_TABLET | ORAL | Status: AC | PRN

## 2024-11-27 MED ORDER — FAMOTIDINE 20 MG PO TABS
20.0000 mg | ORAL_TABLET | Freq: Two times a day (BID) | ORAL | 1 refills | Status: AC
  Filled 2024-11-27: qty 60, 30d supply, fill #0

## 2024-11-27 NOTE — Discharge Summary (Signed)
 Patient ID: Amanda Summers MRN: [id number] DOB/AGE: Apr 11, 1996 29 y.o.  Admit date: 11/20/2024 Discharge date: 11/27/2024  Admission Diagnoses: Hyperemesis gravidarum, hypokalemia  Discharge Diagnoses: Hyperemesis  Prenatal Care Site: [facility]  Prenatal Procedures: none  Consults: Neonatology, Maternal Fetal Medicine  Significant Diagnostic Studies:  Results for orders placed or performed during the hospital encounter of 11/20/24 (from the past week)  CBC   Collection Time: 11/20/24  2:30 PM  Result Value Ref Range   WBC 4.5 4.0 - 10.5 K/uL   RBC 4.51 3.87 - 5.11 MIL/uL   Hemoglobin 11.3 (L) 12.0 - 15.0 g/dL   HCT 65.4 (L) 63.9 - 53.9 %   MCV 76.5 (L) 80.0 - 100.0 fL   MCH 25.1 (L) 26.0 - 34.0 pg   MCHC 32.8 30.0 - 36.0 g/dL   RDW 85.3 88.4 - 84.4 %   Platelets 226 150 - 400 K/uL   nRBC 0.0 0.0 - 0.2 %  Comprehensive metabolic panel   Collection Time: 11/20/24  2:30 PM  Result Value Ref Range   Sodium 133 (L) 135 - 145 mmol/L   Potassium 2.9 (L) 3.5 - 5.1 mmol/L   Chloride 98 98 - 111 mmol/L   CO2 21 (L) 22 - 32 mmol/L   Glucose, Bld 93 70 - 99 mg/dL   BUN 6 6 - 20 mg/dL   Creatinine, Ser 9.36 0.44 - 1.00 mg/dL   Calcium  9.8 8.9 - 10.3 mg/dL   Total Protein 8.1 6.5 - 8.1 g/dL   Albumin 4.2 3.5 - 5.0 g/dL   AST 24 15 - 41 U/L   ALT 20 0 - 44 U/L   Alkaline Phosphatase 73 38 - 126 U/L   Total Bilirubin 0.9 0.0 - 1.2 mg/dL   GFR, Estimated >39 >39 mL/min   Anion gap 13 5 - 15  Lipase, blood   Collection Time: 11/20/24  2:30 PM  Result Value Ref Range   Lipase 22 11 - 51 U/L  Beta-hydroxybutyric acid   Collection Time: 11/20/24  2:30 PM  Result Value Ref Range   Beta-Hydroxybutyric Acid 2.00 (H) 0.05 - 0.27 mmol/L  hCG, quantitative, pregnancy   Collection Time: 11/20/24  6:30 PM  Result Value Ref Range   hCG, Beta Chain, Quant, S 156,100 (H) <5 mIU/mL  Urinalysis, Routine w reflex microscopic -Urine, Clean Catch   Collection Time: 11/20/24 11:33 PM  Result Value  Ref Range   Color, Urine AMBER (A) YELLOW   APPearance CLEAR (A) CLEAR   Specific Gravity, Urine 1.025 1.005 - 1.030   pH 6.0 5.0 - 8.0   Glucose, UA NEGATIVE NEGATIVE mg/dL   Hgb urine dipstick NEGATIVE NEGATIVE   Bilirubin Urine NEGATIVE NEGATIVE   Ketones, ur 20 (A) NEGATIVE mg/dL   Protein, ur 30 (A) NEGATIVE mg/dL   Nitrite NEGATIVE NEGATIVE   Leukocytes,Ua NEGATIVE NEGATIVE   RBC / HPF 0 0 - 5 RBC/hpf   WBC, UA 0-5 0 - 5 WBC/hpf   Bacteria, UA NONE SEEN NONE SEEN   Squamous Epithelial / HPF 0-5 0 - 5 /HPF   Mucus PRESENT   Urine Drug Screen   Collection Time: 11/20/24 11:33 PM  Result Value Ref Range   Opiates NEGATIVE NEGATIVE   Cocaine NEGATIVE NEGATIVE   Benzodiazepines NEGATIVE NEGATIVE   Amphetamines NEGATIVE NEGATIVE   Tetrahydrocannabinol NEGATIVE NEGATIVE   Barbiturates NEGATIVE NEGATIVE   Methadone Scn, Ur NEGATIVE NEGATIVE   Fentanyl  NEGATIVE NEGATIVE  Comprehensive metabolic panel   Collection Time: 11/21/24  5:33 AM  Result Value Ref Range   Sodium 134 (L) 135 - 145 mmol/L   Potassium 3.3 (L) 3.5 - 5.1 mmol/L   Chloride 101 98 - 111 mmol/L   CO2 20 (L) 22 - 32 mmol/L   Glucose, Bld 75 70 - 99 mg/dL   BUN <5 (L) 6 - 20 mg/dL   Creatinine, Ser 9.35 0.44 - 1.00 mg/dL   Calcium  9.1 8.9 - 10.3 mg/dL   Total Protein 6.7 6.5 - 8.1 g/dL   Albumin 3.6 3.5 - 5.0 g/dL   AST 20 15 - 41 U/L   ALT 17 0 - 44 U/L   Alkaline Phosphatase 60 38 - 126 U/L   Total Bilirubin 1.0 0.0 - 1.2 mg/dL   GFR, Estimated >39 >39 mL/min   Anion gap 13 5 - 15  Basic metabolic panel with GFR   Collection Time: 11/22/24  9:10 AM  Result Value Ref Range   Sodium 135 135 - 145 mmol/L   Potassium 3.0 (L) 3.5 - 5.1 mmol/L   Chloride 103 98 - 111 mmol/L   CO2 24 22 - 32 mmol/L   Glucose, Bld 94 70 - 99 mg/dL   BUN <5 (L) 6 - 20 mg/dL   Creatinine, Ser 9.37 0.44 - 1.00 mg/dL   Calcium  9.3 8.9 - 10.3 mg/dL   GFR, Estimated >39 >39 mL/min   Anion gap 9 5 - 15  Magnesium     Collection Time: 11/22/24  9:10 AM  Result Value Ref Range   Magnesium  1.6 (L) 1.7 - 2.4 mg/dL  TSH   Collection Time: 11/22/24 12:27 PM  Result Value Ref Range   TSH 0.364 0.350 - 4.500 uIU/mL  Basic metabolic panel with GFR   Collection Time: 11/23/24  7:33 AM  Result Value Ref Range   Sodium 138 135 - 145 mmol/L   Potassium 3.0 (L) 3.5 - 5.1 mmol/L   Chloride 105 98 - 111 mmol/L   CO2 24 22 - 32 mmol/L   Glucose, Bld 89 70 - 99 mg/dL   BUN <5 (L) 6 - 20 mg/dL   Creatinine, Ser 9.40 0.44 - 1.00 mg/dL   Calcium  9.1 8.9 - 10.3 mg/dL   GFR, Estimated >39 >39 mL/min   Anion gap 9 5 - 15  Magnesium    Collection Time: 11/23/24  7:33 AM  Result Value Ref Range   Magnesium  1.8 1.7 - 2.4 mg/dL  Glucose, fasting - while on taper   Collection Time: 11/24/24  6:17 AM  Result Value Ref Range   Glucose, Bld 95 70 - 99 mg/dL  Magnesium    Collection Time: 11/24/24  6:17 AM  Result Value Ref Range   Magnesium  1.8 1.7 - 2.4 mg/dL  Renal function panel   Collection Time: 11/24/24  6:18 AM  Result Value Ref Range   Sodium 135 135 - 145 mmol/L   Potassium 3.8 3.5 - 5.1 mmol/L   Chloride 103 98 - 111 mmol/L   CO2 23 22 - 32 mmol/L   Glucose, Bld 92 70 - 99 mg/dL   BUN <5 (L) 6 - 20 mg/dL   Creatinine, Ser 9.39 0.44 - 1.00 mg/dL   Calcium  9.0 8.9 - 10.3 mg/dL   Phosphorus 2.4 (L) 2.5 - 4.6 mg/dL   Albumin 3.3 (L) 3.5 - 5.0 g/dL   GFR, Estimated >39 >39 mL/min   Anion gap 8 5 - 15  Magnesium    Collection Time: 11/25/24  5:00 AM  Result Value Ref  Range   Magnesium  1.9 1.7 - 2.4 mg/dL  Renal function panel   Collection Time: 11/25/24  7:30 AM  Result Value Ref Range   Sodium 135 135 - 145 mmol/L   Potassium 3.8 3.5 - 5.1 mmol/L   Chloride 103 98 - 111 mmol/L   CO2 23 22 - 32 mmol/L   Glucose, Bld 99 70 - 99 mg/dL   BUN <5 (L) 6 - 20 mg/dL   Creatinine, Ser 9.42 0.44 - 1.00 mg/dL   Calcium  9.1 8.9 - 10.3 mg/dL   Phosphorus 3.7 2.5 - 4.6 mg/dL   Albumin 3.3 (L) 3.5 - 5.0 g/dL    GFR, Estimated >39 >39 mL/min   Anion gap 8 5 - 15  Basic metabolic panel with GFR   Collection Time: 11/26/24  5:18 AM  Result Value Ref Range   Sodium 136 135 - 145 mmol/L   Potassium 3.9 3.5 - 5.1 mmol/L   Chloride 103 98 - 111 mmol/L   CO2 25 22 - 32 mmol/L   Glucose, Bld 103 (H) 70 - 99 mg/dL   BUN <5 (L) 6 - 20 mg/dL   Creatinine, Ser 9.38 0.44 - 1.00 mg/dL   Calcium  9.1 8.9 - 10.3 mg/dL   GFR, Estimated >39 >39 mL/min   Anion gap 8 5 - 15  Phosphorus   Collection Time: 11/26/24  5:18 AM  Result Value Ref Range   Phosphorus 4.1 2.5 - 4.6 mg/dL  Magnesium    Collection Time: 11/26/24  5:18 AM  Result Value Ref Range   Magnesium  1.8 1.7 - 2.4 mg/dL  Glucose, capillary   Collection Time: 11/26/24  5:57 PM  Result Value Ref Range   Glucose-Capillary 118 (H) 70 - 99 mg/dL  Basic metabolic panel with GFR   Collection Time: 11/27/24  2:59 AM  Result Value Ref Range   Sodium 134 (L) 135 - 145 mmol/L   Potassium 3.6 3.5 - 5.1 mmol/L   Chloride 99 98 - 111 mmol/L   CO2 26 22 - 32 mmol/L   Glucose, Bld 93 70 - 99 mg/dL   BUN <5 (L) 6 - 20 mg/dL   Creatinine, Ser 9.41 0.44 - 1.00 mg/dL   Calcium  9.5 8.9 - 10.3 mg/dL   GFR, Estimated >39 >39 mL/min   Anion gap 9 5 - 15    Treatments: IV hydration; electrolyte replacement, management of nausea/vomiting.  Hospital Course:  This is a 29 y.o. G3P0010 with IUP at [redacted]w[redacted]d admitted for hyperemesis with electrolyte changes. Nausea/vomiting & ptyalism improved with scheduled IV antiemetics & NPO status. Steroid taper added on HD#3. She was able to tolerate bland diet 1/20 with only one episode of emesis & all meds taken by mouth. Her potassium & magnesium  are now in desired range. She was able to have a bowel movement 1/20 after none for >10d. FHR 150s on doppler. She was deemed stable for discharge to home with outpatient follow up.  Discharge Physical Exam:  BP 107/62 (BP Location: Left Arm)   Pulse 77   Temp 98.2 F (36.8 C) (Oral)    Resp 18   Ht 5' 7 (1.702 m)   Wt 109.8 kg   LMP 09/09/2023   SpO2 97%   BMI 37.90 kg/m   General: NAD CV: RRR Pulm: CTABL, nl effort ABD: s/nd/nt, gravid, [id number] DVT Evaluation: LE non-ttp, no evidence of DVT on exam.   Discharge Condition: Stable  Disposition: Discharge disposition: 01-Home or Self Care  Discharge Instructions     Discharge activity:  No Restrictions   Complete by: As directed    Discharge diet:   Complete by: As directed    Continue bland foods for next 3-5d, avoid fried & spicy foods.   No sexual activity restrictions   Complete by: As directed       Allergies as of 11/27/2024   No Known Allergies      Medication List     TAKE these medications    acetaminophen  325 MG tablet Commonly known as: TYLENOL  Take 2 tablets (650 mg total) by mouth every 4 (four) hours as needed (for pain scale < 4  OR  temperature  >/=  100.5 F).   calcium  carbonate 500 MG chewable tablet Commonly known as: TUMS - dosed in mg elemental calcium  Chew 2 tablets (400 mg of elemental calcium  total) by mouth every 4 (four) hours as needed for indigestion.   docusate sodium  100 MG capsule Commonly known as: COLACE Take 1 capsule (100 mg total) by mouth daily.   doxylamine  (Sleep) 25 MG tablet Commonly known as: UNISOM  Take 1 tablet (25 mg total) by mouth 2 (two) times daily.   famotidine  20 MG tablet Commonly known as: PEPCID  Take 1 tablet (20 mg total) by mouth every 12 (twelve) hours.   feeding supplement Liqd Take 237 mLs by mouth 3 (three) times daily between meals.   methylPREDNISolone  4 MG tablet Commonly known as: MEDROL  Take 2 tablets (8 mg total) by mouth daily for 6 days, THEN 1 tablet (4 mg total) daily for 2 days. Start taking on: November 27, 2024   metoCLOPramide  10 MG tablet Commonly known as: REGLAN  Take 1 tablet (10 mg total) by mouth every 6 (six) hours. What changed:  when to take this reasons to take this    multivitamin-prenatal 27-0.8 MG Tabs tablet Take 1 tablet by mouth daily at 12 noon.   ondansetron  4 MG disintegrating tablet Commonly known as: ZOFRAN -ODT Take 1 tablet (4 mg total) by mouth every 6 (six) hours. What changed:  medication strength how much to take when to take this reasons to take this   promethazine  25 MG tablet Commonly known as: PHENERGAN  Take 0.5-1 tablets (12.5-25 mg total) by mouth every 4 (four) hours as needed for refractory nausea / vomiting, vomiting or nausea. What changed:  how much to take when to take this reasons to take this   promethazine  12.5 MG suppository Commonly known as: PHENERGAN  Place 1-2 suppositories (12.5-25 mg total) rectally every 4 (four) hours as needed for refractory nausea / vomiting, nausea or vomiting. What changed: You were already taking a medication with the same name, and this prescription was added. Make sure you understand how and when to take each.   promethazine  25 MG suppository Commonly known as: PHENERGAN  Place 1 suppository (25 mg total) rectally every 6 (six) hours as needed for nausea or vomiting. May use 1/2 to 1 suppository What changed: You were already taking a medication with the same name, and this prescription was added. Make sure you understand how and when to take each.   pyridOXINE  25 MG tablet Commonly known as: VITAMIN B6 Take 1 tablet (25 mg total) by mouth 2 (two) times daily.   scopolamine  1 MG/3DAYS Commonly known as: TRANSDERM-SCOP Place 1 patch (1 mg total) onto the skin every 3 (three) days. Start taking on: November 28, 2024         Signed:  Harlene LITTIE Cisco 11/27/2024 12:53 PM

## 2024-11-27 NOTE — Progress Notes (Signed)
Pt discharged home.  Discharge instructions, prescriptions and follow up appointment given to and reviewed with pt.  Pt verbalized understanding.  Escorted by auxillary. 

## 2024-11-27 NOTE — Consult Note (Signed)
 PHARMACY CONSULT NOTE - FOLLOW UP  Pharmacy Consult for Electrolyte Monitoring and Replacement   Recent Labs: Potassium (mmol/L)  Date Value  11/27/2024 3.6   Magnesium  (mg/dL)  Date Value  98/79/7973 1.8   Calcium  (mg/dL)  Date Value  98/78/7973 9.5   Albumin (g/dL)  Date Value  98/80/7973 3.3 (L)  11/03/2023 3.9 (L)   Phosphorus (mg/dL)  Date Value  98/79/7973 4.1   Sodium (mmol/L)  Date Value  11/27/2024 134 (L)  11/03/2023 136   Assessment: 29 y.o. G3P0010 at [redacted]w[redacted]d admitted for hyperemesis.  She has had several days of vomiting that has worsened. Currently NPO.   Goal of Therapy:  Electrolytes WNL  Plan:  K 3.6 - no replacement indicated for today. F/u with AM labs.   Thank you for involving pharmacy in this patient's care.   Damien Napoleon, PharmD Clinical Pharmacist 11/27/2024 7:09 AM

## 2024-12-05 ENCOUNTER — Ambulatory Visit: Admitting: Certified Nurse Midwife

## 2024-12-05 ENCOUNTER — Other Ambulatory Visit (HOSPITAL_COMMUNITY)
Admission: RE | Admit: 2024-12-05 | Discharge: 2024-12-05 | Disposition: A | Source: Ambulatory Visit | Attending: Certified Nurse Midwife | Admitting: Certified Nurse Midwife

## 2024-12-05 VITALS — BP 105/72 | HR 110 | Wt 249.6 lb

## 2024-12-05 DIAGNOSIS — Z131 Encounter for screening for diabetes mellitus: Secondary | ICD-10-CM

## 2024-12-05 DIAGNOSIS — Z1379 Encounter for other screening for genetic and chromosomal anomalies: Secondary | ICD-10-CM

## 2024-12-05 DIAGNOSIS — Z3A12 12 weeks gestation of pregnancy: Secondary | ICD-10-CM

## 2024-12-05 DIAGNOSIS — O99210 Obesity complicating pregnancy, unspecified trimester: Secondary | ICD-10-CM

## 2024-12-05 DIAGNOSIS — K117 Disturbances of salivary secretion: Secondary | ICD-10-CM

## 2024-12-05 DIAGNOSIS — Z369 Encounter for antenatal screening, unspecified: Secondary | ICD-10-CM | POA: Diagnosis present

## 2024-12-05 DIAGNOSIS — Z3480 Encounter for supervision of other normal pregnancy, unspecified trimester: Secondary | ICD-10-CM

## 2024-12-05 DIAGNOSIS — Z1329 Encounter for screening for other suspected endocrine disorder: Secondary | ICD-10-CM

## 2024-12-05 DIAGNOSIS — Z113 Encounter for screening for infections with a predominantly sexual mode of transmission: Secondary | ICD-10-CM

## 2024-12-05 DIAGNOSIS — Z114 Encounter for screening for human immunodeficiency virus [HIV]: Secondary | ICD-10-CM

## 2024-12-05 DIAGNOSIS — O210 Mild hyperemesis gravidarum: Secondary | ICD-10-CM

## 2024-12-05 DIAGNOSIS — Z3401 Encounter for supervision of normal first pregnancy, first trimester: Secondary | ICD-10-CM

## 2024-12-05 DIAGNOSIS — Z3481 Encounter for supervision of other normal pregnancy, first trimester: Secondary | ICD-10-CM

## 2024-12-05 DIAGNOSIS — Z0184 Encounter for antibody response examination: Secondary | ICD-10-CM

## 2024-12-05 NOTE — Patient Instructions (Signed)
 Second Trimester of Pregnancy  The second trimester of pregnancy is from week 14 through week 27. This is months 4 through 6 of pregnancy. During the second trimester: Morning sickness is less or has stopped. You may have more energy. You may feel hungry more often. At this time, your unborn baby is growing very fast. At the end of the sixth month, the unborn baby may be up to 12 inches long and weigh about 1 pounds. You will likely start to feel the baby move between 16 and 20 weeks of pregnancy. Body changes during your second trimester Your body continues to change during this time. The changes usually go away after your baby is born. Physical changes You will gain more weight. Your belly will get bigger. You may begin to get stretch marks on your hips, belly, and breasts. Your breasts will keep growing and may hurt. You may get dark spots or blotches on your face. A dark line from your belly button to the pubic area may appear. This line is called linea nigra. Your hair may grow faster and get thicker. Health changes You may have headaches. You may have heartburn. You may pee more often. You may have swollen, bulging veins (varicose veins). You may have trouble pooping (constipation), or swollen veins in the butt that can itch or get painful (hemorrhoids). You may have back pain. This is caused by: Weight gain. Pregnancy hormones that are relaxing the joints in your pelvis. Follow these instructions at home: Medicines Talk to your health care provider if you're taking medicines. Ask if the medicines are safe to take during pregnancy. Your provider may change the medicines that you take. Do not take any medicines unless told to by your provider. Take a prenatal vitamin that has at least 600 micrograms (mcg) of folic acid. Do not use herbal medicines, illegal drugs, or medicines that are not approved by your provider. Eating and drinking While you're pregnant your body needs  extra food for your growing baby. Talk with your provider about what to eat while pregnant. Activity Most women are able to exercise during pregnancy. Exercises may need to change as your pregnancy goes on. Talk to your provider about your activities and exercise routines. Relieving pain and discomfort Wear a good, supportive bra if your breasts hurt. Rest with your legs raised if you have leg cramps or low back pain. Take warm sitz baths to soothe pain from hemorrhoids. Use hemorrhoid cream if your provider says it's okay. Do not douche. Do not use tampons or scented pads. Do not use hot tubs, steam rooms, or saunas. Safety Wear your seatbelt at all times when you're in a car. Talk to your provider if someone hits you, hurts you, or yells at you. Talk with your provider if you're feeling sad or have thoughts of hurting yourself. Lifestyle Certain things can be harmful while you're pregnant. It's best to avoid the following: Do not drink alcohol,smoke, vape, or use products with nicotine or tobacco in them. If you need help quitting, talk with your provider. Avoid cat litter boxes and soil used by cats. These things carry germs that can cause harm to your pregnancy and your baby. General instructions Keep all follow-up visits. It helps you and your unborn baby stay as healthy as possible. Write down your questions. Take them to your prenatal visits. Your provider will: Talk with you about your overall health. Give you advice or refer you to specialists who can help with different needs,  including: Prenatal education classes. Mental health and counseling. Foods and healthy eating. Ask for help if you need help with food. Where to find more information American Pregnancy Association: americanpregnancy.org [employer]: acog.org Office on [employer] Health: [place].ca Contact a health care provider if: You have a headache that does not go away  when you take medicine. You have any of these problems: You can't eat or drink. You throw up or feel like you may throw up. You have watery poop (diarrhea) for 2 days or more. You have pain when you pee or your pee smells bad. You have been sick for 2 days or more and are not getting better. Contact your provider right away if: You have any of these coming from your vagina: Abnormal discharge. Bad-smelling fluid. Bleeding. Your baby is moving less than usual. You have contractions, belly cramping, or have pain in your pelvis or lower back. You have symptoms of high blood pressure or preeclampsia. These include: A severe, throbbing headache that does not go away. Sudden or extreme swelling of your face, hands, legs, or feet. Vision problems: You see spots. You have blurry vision. Your eyes are sensitive to light. If you can't reach the provider, go to an urgent care or emergency room. Get help right away if: You faint, become confused, or can't think clearly. You have chest pain or trouble breathing. You have any kind of injury, such as from a fall or a car crash. These symptoms may be an emergency. Call 911 right away. Do not wait to see if the symptoms will go away. Do not drive yourself to the hospital. This information is not intended to replace advice given to you by your health care provider. Make sure you discuss any questions you have with your health care provider. Document Revised: 07/27/2023 Document Reviewed: 02/24/2023 Elsevier Patient Education  2024 [employer] Inc[employer]Pregnancy: Healthy Eating While you're pregnant, your body needs extra nutrition for your growing baby. You also need more vitamins and minerals, such as folic acid, calcium , iron, and vitamin D. Eating a balanced diet is important for both you and your baby. Your need for extra calories will change during pregnancy. During the first 3 months of pregnancy, called the first trimester, you don't need more  calories. During the second trimester, you'll need about 340 extra calories a day. During the third trimester, you'll need about 450 extra calories a day. If you're carrying more than one baby, talk with your health care provider or a dietitian to learn more about your specific eating needs. What are tips for eating healthy during pregnancy? Meal planning  Eating smaller meals throughout the day may help manage some side effects common in pregnancy, like heartburn and reflux. Eat a variety of foods. Be sure to include many types of fruits and vegetables. Two or more servings of fish are recommended each week. Choose fish that are lower in mercury, such as salmon and pollock. Limit foods that have empty calories. These are foods that have little nutritional value, such as sweets, desserts, candies, and drinks with sugar in them. Drinks that have caffeine are OK to drink, but it's better to avoid caffeine. Limit your total caffeine intake to less than 200 mg each day, or the limit you're told by your provider. Be aware that 200 mg of caffeine is 12 oz or 355 mL of coffee, tea, or soda. General information Take a prenatal vitamin to help meet your vitamin and mineral needs during pregnancy.  This includes your need for folic acid, iron, calcium , and vitamin D. Do not try to lose weight or go on a diet during pregnancy. Food safety  Wash your hands before you eat and after you prepare raw meat. Wash all fruits and vegetables well before peeling or eating. Make sure that all meats, poultry, and eggs are cooked to food-safe temperatures or well-done. Taking these actions can help keep your food safe and protect you and your baby from dangerous food illnesses. Ask your provider for more information. What foods should I eat? Fruits All fruits. Eat a variety of colors and types of fruit. Remember to wash your fruits well before peeling or eating. Vegetables All vegetables. Eat a variety of  colors and types of vegetables. Remember to wash your vegetables well before peeling or eating. Grains All grains. Choose whole grains, such as whole-wheat bread, oatmeal, or brown rice. Meats and other protein foods Lean meats, including chicken, turkey, and lean cuts of beef, veal, or pork. Fish that is higher in omega-3 fatty acids and lower in mercury, such as salmon, herring, mussels, trout, sardines, pollock, shrimp, crab, and lobster. Tofu. Tempeh. Beans. Eggs. Peanut butter and other nut butters. Dairy Pasteurized milk and milk alternatives, such as soy milk. Pasteurized yogurt and pasteurized cheese. Cottage cheese. Sour cream. Beverages Water. Juices that contain 100% fruit juice or vegetable juice. Caffeine-free teas and decaffeinated coffee. Fats and oils Fats and oils are OK to include in moderation. Sweets and desserts Sweets and desserts are OK to include in moderation. Seasoning and other foods All pasteurized condiments. The items listed above may not be all the foods and drinks you can have. Talk with a dietitian to learn more. What does 340 extra calories look like? Healthy snacks that give you 340 more calories a day could be: Peanut butter and jelly with milk: 8 oz (237 mL) of low-fat milk. Peanut butter and jelly sandwich made with: 1 slice of whole-wheat bread. 2 teaspoons (10 g) of peanut butter. Yogurt and berries: 1 cup (245 g) of Greek yogurt. 1 cup (150 g) of berries. 2 tablespoons (30 g) of chopped nuts, such as almonds or walnuts. Avocado toast: 1 slice of whole-wheat bread. 1/2 medium avocado (70 g). 1 large egg (50 g). What foods should I avoid? Fruits Raw (unpasteurized) fruit juices. Vegetables Unpasteurized vegetable juices. Meats and other protein foods Precooked or cured meat, such as bologna, hot dogs, sausages, or meat loaves. (If you must eat those meats, reheat them until they are steaming hot.) Refrigerated pate, meat spreads from a meat  counter, or smoked seafood that's found in the refrigerated section of a store. Raw or undercooked meats, poultry, and eggs. Raw fish, such as sushi or sashimi. Fish that have high mercury content, such as tilefish, shark, swordfish, and king mackerel. Dairy Unpasteurized or raw milk and any foods that are made from them. Some of these may be: Homemade yogurts or puddings. Soft cheeses such as: Feta. Queso blanco or fresco. Pharmacist, Hospital or [place]. Blue-veined cheeses. Some of these types of cheeses may be made with pasteurized milk. Check the label. If pasteurized milk is used, they are OK to eat during pregnancy. Deli foods Premade foods from a store or deli, like chicken salad, coleslaw, or egg salad. These are riskier for food illness than fresh or homemade salads. Beverages Alcohol. Sugar-sweetened drinks, such as sodas or teas. Energy drinks. Seasoning and other foods Homemade fermented foods and drinks, such as: Pickles. Sauerkraut. Kombucha. Store-bought  pasteurized versions of these are OK. The items listed above may not be all the foods and drinks you should avoid. Talk with a dietitian to learn more. Where to find more information To learn more, go to: Centers for Disease Control and Prevention at tonerpromos.no. Click Search and type food choices for pregnancy. Find the link you need. MyPlate at http://pittman-dennis.biz/. This information is not intended to replace advice given to you by your health care provider. Make sure you discuss any questions you have with your health care provider. Document Revised: 10/11/2023 Document Reviewed: 10/11/2023 Elsevier Patient Education  2025 Elsevier Inc.Round Ligament Pain  The round ligaments are a pair of cord-like tissues that help support the uterus. They can become a source of pain during pregnancy as the ligaments soften and stretch as the baby grows. The pain usually begins in the second trimester (13-28 weeks) of pregnancy, and  should only last for a few seconds when it occurs. However, the pain can come and go until the baby is delivered. The pain does not cause harm to the baby. Round ligament pain is usually a short, sharp, and pinching pain, but it can also be a dull, lingering, and aching pain. The pain is felt in the lower side of the abdomen or in the groin. It usually starts deep in the groin and moves up to the outside of the hip area. The pain may happen when you: Suddenly change position, such as quickly going from a sitting to standing position. Do physical activity. Cough or sneeze. Follow these instructions at home: Managing pain  When the pain starts, relax. Then, try any of these methods to help with the pain: Sit down. Flex your knees up to your abdomen. Lie on your side with one pillow under your abdomen and another pillow between your legs. Sit in a warm bath for 15-20 minutes or until the pain goes away. General instructions Watch your condition for any changes. Move slowly when you sit down or stand up. Stop or reduce your physical activities if they cause pain. Avoid long walks if they cause pain. Take over-the-counter and prescription medicines only as told by your health care provider. Keep all follow-up visits. This is important. Contact a health care provider if: Your pain does not go away with treatment. You feel pain in your back that you did not have before. Your medicine is not helping. You have a fever or chills. You have nausea or vomiting. You have diarrhea. You have pain when you urinate. Get help right away if: You have pain that is a rhythmic, cramping pain similar to labor pains. Labor pains are usually 2 minutes apart, last for about 1 minute, and involve a bearing down feeling or pressure in your pelvis. You have vaginal bleeding. These symptoms may represent a serious problem that is an emergency. Do not wait to see if the symptoms will go away. Get medical help right  away. Call your local emergency services (911 in the [place] Do not drive yourself to the hospital. Summary Round ligament pain is felt in the lower abdomen or groin. This pain usually begins in the second trimester (13-28 weeks) and should only last for a few seconds when it occurs. You may notice the pain when you suddenly change position, when you cough or sneeze, or during physical activity. Relaxing, flexing your knees to your abdomen, lying on one side, or taking a warm bath may help to get rid of the pain. Contact your health  care provider if the pain does not go away. This information is not intended to replace advice given to you by your health care provider. Make sure you discuss any questions you have with your health care provider. Document Revised: 01/06/2021 Document Reviewed: 01/06/2021 Elsevier Patient Education  2024 Elsevier Inc.Exercise During Pregnancy Exercise is an important part of being healthy for people of all ages. Exercise helps your heart and lungs work well. Exercise also: Helps you stay strong and flexible. Helps you keep a healthy body weight. Boosts your energy levels and improves your mood. You should try to exercise regularly during pregnancy. Exercise routines may need to change later in your pregnancy. In rare cases, certain medical problems in your pregnancy may limit the exercise you can do during pregnancy. Your health care provider will give you information on what exercises will work for you. How does exercise help during pregnancy? Along with staying strong and flexible, exercising during pregnancy can help: Keep strength in muscles that are used during labor and birth. Control weight gain. Speed up your recovery after giving birth. Reduce the need for insulin if you get diabetes during pregnancy. Decrease low back pain. Lower the risk for depression. Lower the risk of cesarean delivery. Treat trouble pooping (constipation). How does exercise  affect my baby? Exercise can help you have a healthy pregnancy. Exercise does not cause your baby to be born early. It will not cause your baby to weigh less at birth. What exercises can I do? Many exercises are safe for you to do during pregnancy. Do a variety of exercises that safely increase your heart and breathing rates and help you build and maintain muscle strength. Do exercises as told by your provider. Your provider may recommend: Walking. Swimming. Water aerobics. Riding a stationary bike. Modified yoga or Pilates. Tell your instructor that you're pregnant. Avoid overstretching. Avoid lying on your back for long periods of time. Resistance exercises with weights or elastic bands. Running or jogging. Choose this type of exercise only if: You ran or jogged regularly before your pregnancy. You can run or jog and still talk in full sentences. What exercises should I avoid? You may be told to limit high-intensity exercise depending on your level of fitness and if you exercised regularly before you became pregnant. You can tell that you're exercising at a high intensity if you're breathing much harder and faster and can't hold a conversation while exercising. You may be told to: Avoid jogging or running, unless you jogged or ran regularly before you became pregnant. Do not run or jog so fast that you're unable to have a conversation. Avoid activities that put you at risk for falling on your belly or getting hit in the belly. Some of these are: Downhill skiing. Rock climbing. Cycling and gymnastics. Horseback riding. Surfing and waterskiing. Contact sports. Avoid scuba diving. Avoid skydiving. Avoid activities that take place in a room that's heated to high temperatures, such as hot yoga or hot Pilates. How do I exercise in a safe way?  Start slowly. Ask your provider to recommend the types of exercise that are safe for you. Avoid overheating. Do not exercise in very high  temperatures or hot rooms. Avoid hot yoga or hot Pilates. Avoid standing still or lying flat on your back as much as you can. Avoid losing too much fluid (dehydration). Drink more fluids as told. Drink before, during, and after you exercise. Avoid overstretching. Because of hormone changes during pregnancy, it's easy to overstretch muscles,  tendons, and ligaments. Ligaments are the tissues that connect bones to each other. Do not exercise to lose weight. Do not exercise at more than 6,000 feet above sea level (high elevation) if you don't live at that elevation. Tips and recommendations Wear loose-fitting, breathable clothes. Wear a sports bra to support your breasts. Exercise on most days or all days of the week. Try to exercise for 30 minutes a day, 5 days a week. If problems come up during your pregnancy, you provider may tell you to limit some exercises or to exercise less. If you have concerns, ask your provider. If you actively exercised before your pregnancy, your provider may tell you to continue to do moderate-intensity to high-intensity exercise. If you're just starting to exercise or didn't exercise much before your pregnancy, your provider may tell you to do low-intensity to moderate-intensity exercise. Questions to ask your health care provider Is exercise safe for me? What are signs that I should stop exercising? Does my health condition mean that I should not exercise during pregnancy? When should I avoid exercising during pregnancy? Stop exercising and contact a health care provider if: You have any unusual symptoms, such as: Mild contractions or cramps in the belly. Dizziness that does not go away when you rest. Headache. Pain and swelling of your calves. Bleeding or fluid leaking from your vagina. Stop exercising and get help right away if: You have: Chest pain. Shortness of breath. Sudden, severe pain in your low back or your belly. Regular, painful contractions  before 37 weeks of pregnancy. These symptoms may be an emergency. Call 911 right away. Do not wait to see if the symptoms will go away. Do not drive yourself to the hospital. This information is not intended to replace advice given to you by your health care provider. Make sure you discuss any questions you have with your health care provider. Document Revised: 06/19/2023 Document Reviewed: 06/19/2023 Elsevier Patient Education  2024 [employer].

## 2024-12-06 ENCOUNTER — Encounter: Admitting: Licensed Practical Nurse

## 2024-12-06 LAB — CBC/D/PLT+RPR+RH+ABO+RUBIGG...
Antibody Screen: NEGATIVE
Basophils Absolute: 0 10*3/uL (ref 0.0–0.2)
Basos: 0 %
EOS (ABSOLUTE): 0.1 10*3/uL (ref 0.0–0.4)
Eos: 1 %
HCV Ab: NONREACTIVE
HIV Screen 4th Generation wRfx: NONREACTIVE
Hematocrit: 35 % (ref 34.0–46.6)
Hemoglobin: 11 g/dL — ABNORMAL LOW (ref 11.1–15.9)
Hepatitis B Surface Ag: NEGATIVE
Immature Grans (Abs): 0 10*3/uL (ref 0.0–0.1)
Immature Granulocytes: 0 %
Lymphocytes Absolute: 2.5 10*3/uL (ref 0.7–3.1)
Lymphs: 34 %
MCH: 24.9 pg — ABNORMAL LOW (ref 26.6–33.0)
MCHC: 31.4 g/dL — ABNORMAL LOW (ref 31.5–35.7)
MCV: 79 fL (ref 79–97)
Monocytes Absolute: 0.3 10*3/uL (ref 0.1–0.9)
Monocytes: 4 %
Neutrophils Absolute: 4.5 10*3/uL (ref 1.4–7.0)
Neutrophils: 61 %
Platelets: 255 10*3/uL (ref 150–450)
RBC: 4.42 x10E6/uL (ref 3.77–5.28)
RDW: 15.8 % — ABNORMAL HIGH (ref 11.7–15.4)
RPR Ser Ql: NONREACTIVE
Rh Factor: POSITIVE
Rubella Antibodies, IGG: 4.23 {index}
Varicella zoster IgG: NONREACTIVE
WBC: 7.3 10*3/uL (ref 3.4–10.8)

## 2024-12-06 LAB — CERVICOVAGINAL ANCILLARY ONLY
Chlamydia: NEGATIVE
Comment: NEGATIVE
Comment: NORMAL
Neisseria Gonorrhea: NEGATIVE

## 2024-12-06 LAB — URINALYSIS, ROUTINE W REFLEX MICROSCOPIC
Bilirubin, UA: NEGATIVE
Glucose, UA: NEGATIVE
Ketones, UA: NEGATIVE
Leukocytes,UA: NEGATIVE
Nitrite, UA: NEGATIVE
RBC, UA: NEGATIVE
Specific Gravity, UA: 1.021 (ref 1.005–1.030)
Urobilinogen, Ur: 1 mg/dL (ref 0.2–1.0)
pH, UA: 7 (ref 5.0–7.5)

## 2024-12-06 LAB — PROTEIN / CREATININE RATIO, URINE
Creatinine, Urine: 190.7 mg/dL
Protein, Ur: 17.4 mg/dL
Protein/Creat Ratio: 91 mg/g{creat} (ref 0–200)

## 2024-12-06 LAB — HCV INTERPRETATION

## 2024-12-07 LAB — CULTURE, OB URINE

## 2024-12-07 LAB — MONITOR DRUG PROFILE 14(MW)
Amphetamine Scrn, Ur: NEGATIVE ng/mL
BARBITURATE SCREEN URINE: NEGATIVE ng/mL
BENZODIAZEPINE SCREEN, URINE: NEGATIVE ng/mL
Buprenorphine, Urine: NEGATIVE ng/mL
CANNABINOIDS UR QL SCN: NEGATIVE ng/mL
Cocaine (Metab) Scrn, Ur: NEGATIVE ng/mL
Creatinine(Crt), U: 196.9 mg/dL (ref 20.0–300.0)
Fentanyl, Urine: NEGATIVE pg/mL
Meperidine Screen, Urine: NEGATIVE ng/mL
Methadone Screen, Urine: NEGATIVE ng/mL
OXYCODONE+OXYMORPHONE UR QL SCN: NEGATIVE ng/mL
Opiate Scrn, Ur: NEGATIVE ng/mL
Ph of Urine: 7.2 (ref 4.5–8.9)
Phencyclidine Qn, Ur: NEGATIVE ng/mL
Propoxyphene Scrn, Ur: NEGATIVE ng/mL
SPECIFIC GRAVITY: 1.019
Tramadol Screen, Urine: NEGATIVE ng/mL

## 2024-12-07 LAB — URINE CULTURE, OB REFLEX

## 2024-12-07 LAB — NICOTINE SCREEN, URINE: Cotinine Ql Scrn, Ur: NEGATIVE ng/mL

## 2024-12-09 LAB — MATERNIT 21 PLUS CORE, BLOOD
Fetal Fraction: 12
Result (T21): NEGATIVE
Trisomy 13 (Patau syndrome): NEGATIVE
Trisomy 18 (Edwards syndrome): NEGATIVE
Trisomy 21 (Down syndrome): NEGATIVE

## 2024-12-11 ENCOUNTER — Ambulatory Visit: Payer: Self-pay | Admitting: Certified Nurse Midwife

## 2024-12-11 ENCOUNTER — Encounter: Payer: Self-pay | Admitting: Certified Nurse Midwife

## 2024-12-11 DIAGNOSIS — Z2839 Other underimmunization status: Secondary | ICD-10-CM | POA: Insufficient documentation

## 2024-12-11 DIAGNOSIS — O99810 Abnormal glucose complicating pregnancy: Secondary | ICD-10-CM | POA: Insufficient documentation

## 2024-12-11 LAB — TSH+FREE T4
Free T4: 1.04 ng/dL (ref 0.82–1.77)
TSH: 0.824 u[IU]/mL (ref 0.450–4.500)

## 2024-12-11 LAB — COMPREHENSIVE METABOLIC PANEL WITH GFR
ALT: 26 [IU]/L (ref 0–32)
AST: 25 [IU]/L (ref 0–40)
Albumin: 3.9 g/dL (ref 3.9–4.9)
Alkaline Phosphatase: 80 [IU]/L (ref 41–116)
BUN/Creatinine Ratio: 8 — ABNORMAL LOW (ref 9–23)
BUN: 5 mg/dL — ABNORMAL LOW (ref 6–20)
Bilirubin Total: 0.4 mg/dL (ref 0.0–1.2)
CO2: 21 mmol/L (ref 20–29)
Calcium: 9.4 mg/dL (ref 8.7–10.2)
Chloride: 94 mmol/L — ABNORMAL LOW (ref 96–106)
Creatinine, Ser: 0.65 mg/dL (ref 0.57–1.00)
Globulin, Total: 3.1 g/dL (ref 1.5–4.5)
Glucose: 76 mg/dL (ref 70–99)
Potassium: 3.9 mmol/L (ref 3.5–5.2)
Sodium: 133 mmol/L — ABNORMAL LOW (ref 134–144)
Total Protein: 7 g/dL (ref 6.0–8.5)
eGFR: 121 mL/min/{1.73_m2}

## 2024-12-11 LAB — HEMOGLOBIN A1C
Est. average glucose Bld gHb Est-mCnc: 117 mg/dL
Hgb A1c MFr Bld: 5.7 % — ABNORMAL HIGH (ref 4.8–5.6)

## 2024-12-11 LAB — HGB FRACTIONATION CASCADE
Hgb A2: 2.5 % (ref 1.8–3.2)
Hgb A: 97.2 % (ref 96.4–98.8)
Hgb F: 0.3 % (ref 0.0–2.0)
Hgb S: 0 %

## 2024-12-11 NOTE — Telephone Encounter (Signed)
I contacted patient via phone. I left voicemail for patient to call back to be scheduled.   

## 2024-12-13 ENCOUNTER — Emergency Department
Admission: EM | Admit: 2024-12-13 | Discharge: 2024-12-13 | Disposition: A | Discharge status: Home / Self Care | Source: Home / Self Care | Attending: Emergency Medicine | Admitting: Emergency Medicine

## 2024-12-13 ENCOUNTER — Encounter: Payer: Self-pay | Admitting: Obstetrics

## 2024-12-13 ENCOUNTER — Other Ambulatory Visit: Payer: Self-pay

## 2024-12-13 DIAGNOSIS — O210 Mild hyperemesis gravidarum: Secondary | ICD-10-CM

## 2024-12-13 LAB — CBC WITH DIFFERENTIAL/PLATELET
Abs Immature Granulocytes: 0.05 10*3/uL (ref 0.00–0.07)
Basophils Absolute: 0 10*3/uL (ref 0.0–0.1)
Basophils Relative: 0 %
Eosinophils Absolute: 0 10*3/uL (ref 0.0–0.5)
Eosinophils Relative: 0 %
HCT: 31.6 % — ABNORMAL LOW (ref 36.0–46.0)
Hemoglobin: 10.2 g/dL — ABNORMAL LOW (ref 12.0–15.0)
Immature Granulocytes: 1 %
Lymphocytes Relative: 25 %
Lymphs Abs: 1.8 10*3/uL (ref 0.7–4.0)
MCH: 25 pg — ABNORMAL LOW (ref 26.0–34.0)
MCHC: 32.3 g/dL (ref 30.0–36.0)
MCV: 77.5 fL — ABNORMAL LOW (ref 80.0–100.0)
Monocytes Absolute: 0.4 10*3/uL (ref 0.1–1.0)
Monocytes Relative: 5 %
Neutro Abs: 5.2 10*3/uL (ref 1.7–7.7)
Neutrophils Relative %: 69 %
Platelets: 255 10*3/uL (ref 150–400)
RBC: 4.08 MIL/uL (ref 3.87–5.11)
RDW: 15.6 % — ABNORMAL HIGH (ref 11.5–15.5)
WBC: 7.4 10*3/uL (ref 4.0–10.5)
nRBC: 0 % (ref 0.0–0.2)

## 2024-12-13 LAB — COMPREHENSIVE METABOLIC PANEL WITH GFR
ALT: 39 U/L (ref 0–44)
AST: 32 U/L (ref 15–41)
Albumin: 3.6 g/dL (ref 3.5–5.0)
Alkaline Phosphatase: 83 U/L (ref 38–126)
Anion gap: 12 (ref 5–15)
BUN: 5 mg/dL — ABNORMAL LOW (ref 6–20)
CO2: 23 mmol/L (ref 22–32)
Calcium: 9.5 mg/dL (ref 8.9–10.3)
Chloride: 97 mmol/L — ABNORMAL LOW (ref 98–111)
Creatinine, Ser: 0.54 mg/dL (ref 0.44–1.00)
GFR, Estimated: >60 mL/min
Glucose, Bld: 79 mg/dL (ref 70–99)
Potassium: 3.3 mmol/L — ABNORMAL LOW (ref 3.5–5.1)
Sodium: 131 mmol/L — ABNORMAL LOW (ref 135–145)
Total Bilirubin: 0.5 mg/dL (ref 0.0–1.2)
Total Protein: 7.4 g/dL (ref 6.5–8.1)

## 2024-12-13 LAB — URINALYSIS, ROUTINE W REFLEX MICROSCOPIC
Bilirubin Urine: NEGATIVE
Glucose, UA: NEGATIVE mg/dL
Hgb urine dipstick: NEGATIVE
Ketones, ur: 20 mg/dL — AB
Leukocytes,Ua: NEGATIVE
Nitrite: NEGATIVE
Protein, ur: 30 mg/dL — AB
Specific Gravity, Urine: 1.025 (ref 1.005–1.030)
pH: 5 (ref 5.0–8.0)

## 2024-12-13 LAB — LIPASE, BLOOD: Lipase: 19 U/L (ref 11–51)

## 2024-12-13 LAB — HCG, QUANTITATIVE, PREGNANCY: hCG, Beta Chain, Quant, S: 105661 m[IU]/mL — ABNORMAL HIGH

## 2024-12-13 MED ORDER — LACTATED RINGERS IV BOLUS
1000.0000 mL | Freq: Once | INTRAVENOUS | Status: AC
  Administered 2024-12-13: 1000 mL via INTRAVENOUS

## 2024-12-13 MED ORDER — METOCLOPRAMIDE HCL 5 MG/ML IJ SOLN
10.0000 mg | Freq: Once | INTRAMUSCULAR | Status: AC
  Administered 2024-12-13: 10 mg via INTRAVENOUS
  Filled 2024-12-13: qty 2

## 2024-12-13 MED ORDER — ONDANSETRON HCL 4 MG/2ML IJ SOLN
4.0000 mg | Freq: Once | INTRAMUSCULAR | Status: AC
  Administered 2024-12-13: 4 mg via INTRAVENOUS
  Filled 2024-12-13: qty 2

## 2024-12-13 MED ORDER — FAMOTIDINE IN NACL 20-0.9 MG/50ML-% IV SOLN
20.0000 mg | Freq: Once | INTRAVENOUS | Status: AC
  Administered 2024-12-13: 20 mg via INTRAVENOUS
  Filled 2024-12-13: qty 50

## 2024-12-13 NOTE — ED Notes (Addendum)
 Patient states nausea has lessened. PA-C aware and will hold other two meds for awhile to assess patient.

## 2024-12-13 NOTE — ED Notes (Signed)
 Patient states she is still nauseous. Jenise PA-C aware. Will administer the other two meds. Patient states she has a ride home.

## 2024-12-13 NOTE — ED Provider Notes (Signed)
 "   [facility] Emergency Department Provider Note     Event Date/Time   First MD Initiated Contact with Patient 12/13/24 1637     (approximate)   History   Emesis During Pregnancy   HPI  Amanda Summers is a 29 y.o. female G1P0 presents to the ED at approximately [redacted] weeks gestation, for evaluation hyperemesis.  Patient endorses 48 hours of intermittent nonbloody, nonbilious vomiting.  She is also endorsing a mild headache at this time.  She is unable to keep any solid or liquid foods down.  She reports no vaginal bleeding no vaginal discharge, and minimal cramping.  She notes that she had an ED to L&D hospital admission about 3 weeks ago for management of her hyperemesis.  She reports she is taking her medicines as prescribed that include Reglan  and Zofran  from that admission, but continues to have symptoms.  Patient reports to my nurse, that she was advised by her OB provider, to report to the hospital for admission to L&D.  My nurses attempted to reach out to L&D to confirm this but have been unsuccessful.  Physical Exam   Triage Vital Signs: ED Triage Vitals  Encounter Vitals Group     BP 12/13/24 1530 110/68     Girls Systolic BP Percentile --      Girls Diastolic BP Percentile --      Boys Systolic BP Percentile --      Boys Diastolic BP Percentile --      Pulse Rate 12/13/24 1530 (!) 111     Resp 12/13/24 1530 18     Temp 12/13/24 1530 98.6 F (37 C)     Temp src --      SpO2 12/13/24 1530 98 %     Weight 12/13/24 1531 241 lb (109.3 kg)     Height 12/13/24 1531 5' 7 (1.702 m)     Head Circumference --      Peak Flow --      Pain Score 12/13/24 1530 7     Pain Loc --      Pain Education --      Exclude from Growth Chart --     Most recent vital signs: Vitals:   12/13/24 1530  BP: 110/68  Pulse: (!) 111  Resp: 18  Temp: 98.6 F (37 C)  SpO2: 98%    General Awake, no distress.  NAD HEENT NCAT. PERRL. EOMI. No rhinorrhea.  Mucous membranes are moist.  CV:  Good peripheral perfusion. RRR RESP:  Normal effort. CTA ABD:  No distention.    ED Results / Procedures / Treatments   Labs (all labs ordered are listed, but only abnormal results are displayed) Labs Reviewed  CBC WITH DIFFERENTIAL/PLATELET - Abnormal; Notable for the following components:      Result Value   Hemoglobin 10.2 (*)    HCT 31.6 (*)    MCV 77.5 (*)    MCH 25.0 (*)    RDW 15.6 (*)    All other components within normal limits  COMPREHENSIVE METABOLIC PANEL WITH GFR - Abnormal; Notable for the following components:   Sodium 131 (*)    Potassium 3.3 (*)    Chloride 97 (*)    BUN 5 (*)    All other components within normal limits  HCG, QUANTITATIVE, PREGNANCY - Abnormal; Notable for the following components:   hCG, Beta Chain, Quant, S 105,661 (*)    All other components within normal limits  URINALYSIS, ROUTINE  W REFLEX MICROSCOPIC - Abnormal; Notable for the following components:   Color, Urine AMBER (*)    APPearance HAZY (*)    Ketones, ur 20 (*)    Protein, ur 30 (*)    Bacteria, UA RARE (*)    All other components within normal limits  LIPASE, BLOOD    EKG   RADIOLOGY  No results found.   PROCEDURES:  Critical Care performed: No  Procedures   MEDICATIONS ORDERED IN ED: Medications  ondansetron  (ZOFRAN ) injection 4 mg (4 mg Intravenous Given 12/13/24 1728)  lactated ringers  bolus 1,000 mL (0 mLs Intravenous Stopped 12/13/24 1838)  famotidine  (PEPCID ) IVPB 20 mg premix (0 mg Intravenous Stopped 12/13/24 1920)  metoCLOPramide  (REGLAN ) injection 10 mg (10 mg Intravenous Given 12/13/24 1844)     IMPRESSION / MDM / ASSESSMENT AND PLAN / ED COURSE  I reviewed the triage vital signs and the nursing notes.                              Differential diagnosis includes, but is not limited to, hyperemesis, dehydration, electrolyte derangement, UTI  Patient's presentation is most consistent with acute complicated illness /  injury requiring diagnostic workup.  Patient's diagnosis is consistent with hyperemesis gravidarum.  Treated in the ED with IV fluid bolus, antiemetics, and Pepcid .  She tolerated p.o. without subsequent emesis.  She is endorsing significant proving of her symptoms at the time of this interval evaluation.  Patient will be discharged home with instructions to take the previously prescribed Zofran  and Reglan  that have multiple refills available. Patient is to follow up with [facility] as scheduled, as needed or otherwise directed. Patient is given ED precautions to return to the ED for any worsening or new symptoms.  Clinical Course as of 12/13/24 2333  Fri Dec 13, 2024  1842 Secure message to Camie Lakes, CNM for advice on transfer to L&D versus DC from the ED. [JM]  RETHA Lakes, CNM updated on patient at this time, who is tolerating p.o. without subsequent emesis.  Patient continues with IV fluid and antiemetics. [JM]  1931 Lakes, CNM advised that the patient is tolerating p.o. after IV medication administration is stable for DC from the ED, no indication or need for her to be transferred to L&D at this time. [JM]    Clinical Course User Index [JM] Oluwaferanmi Wain, Candida LULLA Kings, PA-C    FINAL CLINICAL IMPRESSION(S) / ED DIAGNOSES   Final diagnoses:  Hyperemesis gravidarum     Rx / DC Orders   ED Discharge Orders     None        Note:  This document was prepared using Dragon voice recognition software and may include unintentional dictation errors.    Loyd Candida LULLA Kings, PA-C 12/13/24 2333    Dorothyann Drivers, MD 12/13/24 2334  "

## 2024-12-13 NOTE — ED Notes (Signed)
 Patient is on phone. Spits occasionally in emesis bag.

## 2024-12-13 NOTE — ED Provider Triage Note (Signed)
 Emergency Medicine Provider Triage Evaluation Note  Silvana Holecek , a 29 y.o. female  was evaluated in triage.  Pt complains of nausea and vomiting in pregnancy. No relief with ondansetron .  Review of Systems  Positive: Nausea and vomiting Negative: Fever  Physical Exam  BP 110/68   Pulse (!) 111   Temp 98.6 F (37 C)   Resp 18   Ht 5' 7 (1.702 m)   Wt 109.3 kg   LMP 09/09/2023   SpO2 98%   BMI 37.75 kg/m  Gen:   Awake, no distress   Resp:  Normal effort  MSK:   Moves extremities without difficulty  Other:    Medical Decision Making  Medically screening exam initiated at 3:35 PM.  Appropriate orders placed.  Bufford Margaret Cockerill was informed that the remainder of the evaluation will be completed by another provider, this initial triage assessment does not replace that evaluation, and the importance of remaining in the ED until their evaluation is complete.  Labs and urinalysis ordered   Herlinda Kirk NOVAK, FNP 12/13/24 1925

## 2024-12-13 NOTE — ED Triage Notes (Signed)
 Pt comes in via pov with complaints of vomiting the past 48 hours. Pt has complaints of abdominal pain, and headache at this time. Pt states that she is unable to keep anything down. Pt complains of abdominal pain 7/10 at this time. Pt has a history of these episodes during this pregnancy. Pt denies andy vaginal bleeding, and does report minimal cramping.

## 2024-12-13 NOTE — Discharge Instructions (Addendum)
 Take the prescription nausea medicines as directed.  Consider liquid diet including Powerade, Gatorade, Pedialyte, and vitamin water.  You may also take chicken broth or chicken noodle soup.  Advance your diet as tolerated.  Consider making fresh ginger root tea or finding ginger candy at the ethnic grocery store, for natural nausea relief.  Follow-up with your [facility] provider as scheduled.

## 2024-12-30 ENCOUNTER — Ambulatory Visit: Admitting: Internal Medicine

## 2025-01-02 ENCOUNTER — Other Ambulatory Visit

## 2025-01-02 ENCOUNTER — Encounter: Admitting: Licensed Practical Nurse

## 2025-06-13 ENCOUNTER — Inpatient Hospital Stay: Admit: 2025-06-13
